# Patient Record
Sex: Female | Born: 1985 | Race: White | Hispanic: No | Marital: Married | Smoking: Never smoker
Health system: Southern US, Community
[De-identification: ages and names within clinical notes are randomized; demographics above are authoritative.]

## PROBLEM LIST (undated history)

## (undated) DIAGNOSIS — Z789 Other specified health status: Secondary | ICD-10-CM

## (undated) HISTORY — PX: APPENDECTOMY: SHX54

---

## 2014-08-01 ENCOUNTER — Ambulatory Visit (INDEPENDENT_AMBULATORY_CARE_PROVIDER_SITE_OTHER): Payer: BC Managed Care – PPO | Admitting: Internal Medicine

## 2014-08-01 VITALS — BP 108/70 | HR 61 | Temp 98.1°F | Resp 16 | Ht 66.5 in | Wt 150.8 lb

## 2014-08-01 DIAGNOSIS — R300 Dysuria: Secondary | ICD-10-CM

## 2014-08-01 DIAGNOSIS — N3000 Acute cystitis without hematuria: Secondary | ICD-10-CM

## 2014-08-01 LAB — POCT URINALYSIS DIPSTICK
BILIRUBIN UA: NEGATIVE
Glucose, UA: NEGATIVE
Ketones, UA: NEGATIVE
Nitrite, UA: POSITIVE
Protein, UA: NEGATIVE
Urobilinogen, UA: 1
pH, UA: 6

## 2014-08-01 LAB — POCT UA - MICROSCOPIC ONLY
Casts, Ur, LPF, POC: NEGATIVE
Crystals, Ur, HPF, POC: NEGATIVE
Mucus, UA: NEGATIVE
YEAST UA: NEGATIVE

## 2014-08-01 MED ORDER — CIPROFLOXACIN HCL 500 MG PO TABS: 500.0000 mg | ORAL_TABLET | Freq: Two times a day (BID) | ORAL | Status: DC

## 2014-08-01 NOTE — Patient Instructions (Signed)
Take the antibiotic as directed. Increase fluids. Return to have re eval if no improvement in symptoms or if worsening of symptoms.

## 2014-08-01 NOTE — Progress Notes (Signed)
   Subjective:    Patient ID: Melanie Munoz, female    DOB: 20-Dec-1985, 29 y.o.   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  Negative for fever, chills, diaphoresis and fatigue.  Genitourinary: Positive for dysuria, urgency and frequency. Negative for flank pain.  All other systems reviewed and are negative.      Objective:   Physical Exam  Constitutional: She is oriented to person, place, and time. She appears well-developed and well-nourished.  HENT:  Head: Normocephalic and atraumatic.  Mouth/Throat: Oropharynx is clear and moist.  Eyes: Conjunctivae and EOM are normal. Pupils are equal, round, and reactive to light.  Neck: Normal range of motion. Neck supple.  Cardiovascular: Normal rate, regular rhythm, normal heart sounds and intact distal pulses.   Pulmonary/Chest: Effort normal and breath sounds normal.  Abdominal: Soft. Bowel sounds are normal. She exhibits no distension. There is no tenderness. There is no rebound and no guarding.  Musculoskeletal: Normal range of motion.  Neurological: She is alert and oriented to person, place, and time.  Skin: Skin is warm and dry.  Psychiatric: She has a normal mood and affect. Her behavior is normal. Judgment and thought content normal.  Nursing note and vitals reviewed.   No results found for this or any previous visit. No results found for this or any previous visit.     Results for orders placed or performed in visit on 08/01/14  POCT UA - Microscopic Only  Result Value Ref Range   WBC, Ur, HPF, POC 5-10    RBC, urine, microscopic  2-4    Bacteria, U Microscopic small    Mucus, UA neg    Epithelial cells, urine per micros 1-3    Crystals, Ur, HPF, POC neg    Casts, Ur, LPF, POC neg    Yeast, UA neg   POCT urinalysis dipstick  Result Value Ref Range   Color, UA orange    Clarity, UA hazy    Glucose, UA neg    Bilirubin, UA neg    Ketones, UA neg    Spec Grav, UA <=1.005    Blood, UA moderate    pH, UA 6.0    Protein, UA neg    Urobilinogen, UA 1.0    Nitrite, UA pos    Leukocytes, UA small (1+)   dx uti Assessment & Plan:  29 year old female with symptoms of uti will eval ua and rx accordingly with antibiotics and increased po fluids.

## 2016-04-01 ENCOUNTER — Other Ambulatory Visit (HOSPITAL_COMMUNITY): Payer: Self-pay | Admitting: Obstetrics & Gynecology

## 2016-04-01 DIAGNOSIS — N979 Female infertility, unspecified: Secondary | ICD-10-CM

## 2016-04-05 ENCOUNTER — Ambulatory Visit (HOSPITAL_COMMUNITY): Payer: Self-pay

## 2016-08-01 ENCOUNTER — Other Ambulatory Visit: Payer: Self-pay | Admitting: Obstetrics & Gynecology

## 2016-08-01 ENCOUNTER — Ambulatory Visit (HOSPITAL_COMMUNITY)
Admission: RE | Admit: 2016-08-01 | Discharge: 2016-08-01 | Disposition: A | Discharge status: Home / Self Care | Payer: BC Managed Care – PPO | Source: Ambulatory Visit | Attending: Obstetrics & Gynecology | Admitting: Obstetrics & Gynecology

## 2016-08-01 DIAGNOSIS — R52 Pain, unspecified: Secondary | ICD-10-CM | POA: Diagnosis not present

## 2016-08-01 DIAGNOSIS — M7989 Other specified soft tissue disorders: Secondary | ICD-10-CM | POA: Insufficient documentation

## 2016-08-01 NOTE — Progress Notes (Signed)
*  PRELIMINARY RESULTS* Vascular Ultrasound Lower extremity venous duplex has been completed.  Preliminary findings: No evidence of DVT or baker's cyst.   Called results to Dr. Juliene PinaMody.    Farrel DemarkJill Eunice, RDMS, RVT  08/01/2016, 5:03 PM

## 2016-11-09 ENCOUNTER — Inpatient Hospital Stay (HOSPITAL_COMMUNITY): Payer: BC Managed Care – PPO

## 2016-11-09 ENCOUNTER — Encounter (HOSPITAL_COMMUNITY): Payer: Self-pay | Admitting: *Deleted

## 2016-11-09 ENCOUNTER — Inpatient Hospital Stay (HOSPITAL_COMMUNITY)
Admission: AD | Admit: 2016-11-09 | Discharge: 2016-11-12 | DRG: 782 | Disposition: A | Discharge status: Home / Self Care | Payer: BC Managed Care – PPO | Source: Ambulatory Visit | Attending: Obstetrics and Gynecology | Admitting: Obstetrics and Gynecology

## 2016-11-09 DIAGNOSIS — Z3A31 31 weeks gestation of pregnancy: Secondary | ICD-10-CM

## 2016-11-09 DIAGNOSIS — O4693 Antepartum hemorrhage, unspecified, third trimester: Secondary | ICD-10-CM

## 2016-11-09 DIAGNOSIS — O4413 Placenta previa with hemorrhage, third trimester: Secondary | ICD-10-CM | POA: Diagnosis present

## 2016-11-09 DIAGNOSIS — O4403 Placenta previa specified as without hemorrhage, third trimester: Secondary | ICD-10-CM | POA: Diagnosis present

## 2016-11-09 LAB — CBC
HCT: 32 % — ABNORMAL LOW (ref 36.0–46.0)
Hemoglobin: 11.2 g/dL — ABNORMAL LOW (ref 12.0–15.0)
MCH: 33.2 pg (ref 26.0–34.0)
MCHC: 35 g/dL (ref 30.0–36.0)
MCV: 95 fL (ref 78.0–100.0)
Platelets: 196 10*3/uL (ref 150–400)
RBC: 3.37 MIL/uL — AB (ref 3.87–5.11)
RDW: 12 % (ref 11.5–15.5)
WBC: 8.1 10*3/uL (ref 4.0–10.5)

## 2016-11-09 LAB — TYPE AND SCREEN
ABO/RH(D): A POS
Antibody Screen: NEGATIVE

## 2016-11-09 MED ORDER — SODIUM CHLORIDE 0.9% FLUSH: 3.0000 mL | INTRAVENOUS | Status: DC | PRN

## 2016-11-09 MED ORDER — SODIUM CHLORIDE 0.9% FLUSH
3.0000 mL | Freq: Two times a day (BID) | INTRAVENOUS | Status: DC
  Administered 2016-11-10 – 2016-11-12 (×4): 3 mL via INTRAVENOUS

## 2016-11-09 MED ORDER — PRENATAL MULTIVITAMIN CH
1.0000 | ORAL_TABLET | Freq: Every day | ORAL | Status: DC
  Administered 2016-11-10: 1 via ORAL
  Filled 2016-11-09 (×2): qty 1

## 2016-11-09 MED ORDER — BETAMETHASONE SOD PHOS & ACET 6 (3-3) MG/ML IJ SUSP
12.0000 mg | INTRAMUSCULAR | Status: AC
  Administered 2016-11-09 – 2016-11-10 (×2): 12 mg via INTRAMUSCULAR
  Filled 2016-11-09 (×2): qty 2

## 2016-11-09 MED ORDER — CALCIUM CARBONATE ANTACID 500 MG PO CHEW: 2.0000 | CHEWABLE_TABLET | ORAL | Status: DC | PRN

## 2016-11-09 MED ORDER — DOCUSATE SODIUM 100 MG PO CAPS
100.0000 mg | ORAL_CAPSULE | Freq: Every day | ORAL | Status: DC
  Administered 2016-11-10 – 2016-11-11 (×2): 100 mg via ORAL
  Filled 2016-11-09 (×5): qty 1

## 2016-11-09 MED ORDER — ACETAMINOPHEN 325 MG PO TABS: 650.0000 mg | ORAL_TABLET | ORAL | Status: DC | PRN

## 2016-11-09 MED ORDER — ZOLPIDEM TARTRATE 5 MG PO TABS: 5.0000 mg | ORAL_TABLET | Freq: Every evening | ORAL | Status: DC | PRN

## 2016-11-09 MED ORDER — SODIUM CHLORIDE 0.9 % IV SOLN: 250.0000 mL | INTRAVENOUS | Status: DC | PRN

## 2016-11-09 NOTE — MAU Provider Note (Signed)
  History     CSN: [id number]  Arrival date and time: 11/09/16 2055   First Provider Initiated Contact with Patient 11/09/16 2140      Chief Complaint  Patient presents with  . Vaginal Bleeding   HPI [name] is a 31 y.o. G1P0 at 3541w5d who presents to MAU today with complaint of vaginal bleeding. The patient had US in the office on 10/28/16 that showed a complete placenta previa. She states spotting a few weeks ago and then no bleeding until tonight. She states that she bled through her underwear and pants. She put on a pad to come in and had a small amount on the pad. She states some cramping yesterday and intermittent sharp pains tonight. She denies LOF or other complications with the pregnancy. She reports good fetal movement.    OB History    Gravida Para Term Preterm AB Living   1             SAB TAB Ectopic Multiple Live Births                  No past medical history on file.  Past Surgical History:  Procedure Laterality Date  . APPENDECTOMY      Family History  Problem Relation Age of Onset  . Diabetes Mother   . Cancer Father   . Cancer Maternal Grandmother   . Diabetes Maternal Grandfather     Social History  Substance Use Topics  . Smoking status: Never Smoker  . Smokeless tobacco: Not on file  . Alcohol use No    Allergies: No Known Allergies  Prescriptions Prior to Admission  Medication Sig Dispense Refill Last Dose  . Prenatal Vit-Fe Fumarate-FA (PRENATAL MULTIVITAMIN) TABS tablet Take 1 tablet by mouth daily at 12 noon.     . ciprofloxacin (CIPRO) 500 MG tablet Take 1 tablet (500 mg total) by mouth 2 (two) times daily. 10 tablet 0   . phenazopyridine (PYRIDIUM) 95 MG tablet Take 95 mg by mouth 3 (three) times daily as needed for pain.       Review of Systems  Constitutional: Negative for fever.  Gastrointestinal: Positive for abdominal pain and nausea. Negative for constipation, diarrhea and vomiting.  Genitourinary: Positive for vaginal  bleeding. Negative for vaginal discharge.   Physical Exam   Blood pressure 112/75, pulse 89, temperature 98.2 F (36.8 C), temperature source Oral, resp. rate 17, height 5' 6.25" (1.683 m), weight 181 lb (82.1 kg), SpO2 98 %.  Physical Exam  Nursing note and vitals reviewed. Constitutional: She is oriented to person, place, and time. She appears well-developed and well-nourished. No distress.  HENT:  Head: Normocephalic and atraumatic.  Cardiovascular: Normal rate.   Respiratory: Effort normal.  GI: Soft. She exhibits no distension and no mass. There is no tenderness. There is no rebound and no guarding.  Neurological: She is alert and oriented to person, place, and time.  Skin: Skin is warm and dry. No erythema.  Psychiatric: She has a normal mood and affect.    Fetal Monitoring: Baseline: 120 bpm Variability: moderate Accelerations: 15 x 15 Decelerations: none Contractions: none  MAU Course  Procedures None  MDM Discussed patient with Dr. Billy Coastaavon. Admit for BMZ and continuous monitoring.   Assessment and Plan  A: SIUP at 3841w5d Placenta previa, complete Vaginal bleeding in pregnancy, third trimester   P: Admit to [facility] for monitoring and BMZ  Melanie NippleJulie Endre Coutts, PA-C 11/09/2016, 10:05 PM

## 2016-11-09 NOTE — MAU Note (Signed)
Pt presents to MAU c/o bleeding pt states she was bleeding thru her underwear and pants and still is bleeding (bright red like a period)when she went to the bathroom. Pt states cramping yesterday but today she is having pain in the "middle of her stomach today" rating it at a 7 out of 10. Pt states pain under her ribs which has been constant in this pregnancy.

## 2016-11-09 NOTE — MAU Provider Note (Addendum)
History   HISTORY AND PHYSICAL  CSN: [id number]  Arrival date and time: 11/09/16 2055   First Provider Initiated Contact with Patient 11/09/16 2140      Chief Complaint  Patient presents with  . Vaginal Bleeding   Vaginal Bleeding  The patient's pertinent negatives include no vaginal discharge. Associated symptoms include abdominal pain and nausea. Pertinent negatives include no constipation, diarrhea, fever or vomiting.   Melanie Munoz is a 31 y.o. G1P0 at [redacted]w[redacted]d who presents to MAU today with complaint of vaginal bleeding. The patient had [place] in the office on 10/28/16 that showed a complete placenta previa. She states spotting a few weeks ago and then no bleeding until tonight. She states that she bled through her underwear and pants. She put on a pad to come in and had a small amount on the pad. She states some cramping yesterday and intermittent sharp pains tonight. She denies LOF or other complications with the pregnancy. She reports good fetal movement.    OB History    Gravida Para Term Preterm AB Living   1             SAB TAB Ectopic Multiple Live Births                  No past medical history on file.  Past Surgical History:  Procedure Laterality Date  . APPENDECTOMY      Family History  Problem Relation Age of Onset  . Diabetes Mother   . Cancer Father   . Cancer Maternal Grandmother   . Diabetes Maternal Grandfather     Social History  Substance Use Topics  . Smoking status: Never Smoker  . Smokeless tobacco: Not on file  . Alcohol use No    Allergies: No Known Allergies  Prescriptions Prior to Admission  Medication Sig Dispense Refill Last Dose  . Prenatal Vit-Fe Fumarate-FA (PRENATAL MULTIVITAMIN) TABS tablet Take 1 tablet by mouth daily at 12 noon.     . ciprofloxacin (CIPRO) 500 MG tablet Take 1 tablet (500 mg total) by mouth 2 (two) times daily. 10 tablet 0   . phenazopyridine (PYRIDIUM) 95 MG tablet Take 95 mg by mouth 3 (three) times daily as  needed for pain.       Review of Systems  Constitutional: Negative for fever.  Gastrointestinal: Positive for abdominal pain and nausea. Negative for constipation, diarrhea and vomiting.  Genitourinary: Positive for vaginal bleeding. Negative for vaginal discharge.   Physical Exam   Blood pressure 112/75, pulse 89, temperature 98.2 F (36.8 C), temperature source Oral, resp. rate 17, height 5' 6.25" (1.683 m), weight 82.1 kg (181 lb), SpO2 98 %.  Physical Exam  Nursing note and vitals reviewed. Constitutional: She is oriented to person, place, and time. She appears well-developed and well-nourished. No distress.  HENT:  Head: Normocephalic and atraumatic.  Cardiovascular: Normal rate.   Respiratory: Effort normal.  GI: Soft. She exhibits no distension and no mass. There is no tenderness. There is no rebound and no guarding.  Neurological: She is alert and oriented to person, place, and time.  Skin: Skin is warm and dry. No erythema.  Psychiatric: She has a normal mood and affect.  Pelvic: minimal blood on pad. VE deferred  Fetal Monitoring: Baseline: 120 bpm Variability: moderate Accelerations: 15 x 15 Decelerations: none Contractions: none Category 1 tracing  MAU Course  Procedures None  MDM Sono 5/7 - complete posterior previa, nl AFI, AGA Hgb 11.3 Otherwise uncomplicated prenatal care  Assessment and Plan  A: 1120w5d IUP Placenta previa, complete, posterior Vaginal bleeding in pregnancy, third trimester - second bleed, stable No evidence PTL  P: Admit BMZ series Continuous EFM and Toco tonight Consider MgSO4 for neuroprotection pending course. CBC and TS BR and BRP Saline lock NICU consult in am Sono to ck AFI, placental location  Karie Skowron J, PA-C 11/09/2016, 10:55 PM

## 2016-11-10 ENCOUNTER — Encounter (HOSPITAL_COMMUNITY): Payer: Self-pay | Admitting: *Deleted

## 2016-11-10 LAB — ABO/RH: ABO/RH(D): A POS

## 2016-11-10 MED ORDER — LACTATED RINGERS IV BOLUS (SEPSIS)
1000.0000 mL | Freq: Once | INTRAVENOUS | Status: DC
  Administered 2016-11-10: 1000 mL via INTRAVENOUS

## 2016-11-10 MED ORDER — PRENATAL MULTIVITAMIN CH
1.0000 | ORAL_TABLET | Freq: Every day | ORAL | Status: DC
  Administered 2016-11-11: 1 via ORAL
  Filled 2016-11-10: qty 1

## 2016-11-10 MED ORDER — LACTATED RINGERS IV SOLN: Freq: Once | INTRAVENOUS | Status: DC

## 2016-11-10 NOTE — Consult Note (Signed)
Neonatology Consult:  Asked by Dr Billy Coastaavon to consult on Melanie Munoz for placenta previa at 5531 6/7 weeks. Chart reviewed. She was admitted yesterday for vaginal bleeding which appears to be decreasing now. She started receiving betamethasone. Complete previa on US, AGA.  I spoke to Melanie Munoz in her room. I discussed possible scenarios if the baby had to be delivered in the next few days vs later at 35 wks or older. I talked about Neonatology role at delivery. I also discussed neonatal complications at earlier gestation and talked about RDS, various  resp support depending on severity of lung disease. We also talked about breast milk, gavage feeding, IV fluids, and temp support.  I answered her questions fully.  Thank you for inviting us to participate in Melanie Minjares's care.  Lucillie Garfinkelita Q Gurnoor Ursua MD Neonatologist

## 2016-11-10 NOTE — Progress Notes (Signed)
Pt has saline lock in her right hand. CDI dressing. Site WNL. Flushed with 3cc saline flush.

## 2016-11-10 NOTE — Progress Notes (Signed)
Dr. Katrinka BlazingSmith in NICU notified of need for consult.

## 2016-11-10 NOTE — Progress Notes (Addendum)
Patient ID: Melanie BeckAmber Denno, female   DOB: October 04, 1985, 31 y.o.   MRN: [id number] HD #1 31w 6d Complete Previa  S: Good FM. Still with spotting. History of increased bleeding yesterday which has decreased since admission. Denies SROM. No contractions. No SOB. No CP . No pain noted. Felt crampy on Friday. History of one episode of spotting a few weeks ago. No intervention at that time.  O: BP 116/66   Pulse (!) 101   Temp 98.1 F (36.7 C) (Oral)   Resp 16   Ht 5' 6.25" (1.683 m)   Wt 82.1 kg (181 lb)   SpO2 98%   BMI 28.99 kg/m   WDWN WF NAD NCAT Neck: supple with FROM Lgs: CTA CV: RRR Abd: gravid and NT Neg CVAT VE: minimal brownish spotting on pad EXT: SCDs intact Neuro: nonfocal  Skin: intact  Category 1 tracing FHR 120-130, BTBV > 5, 15x15 accels, no decels, no contractions  Sono confirms left lateral post complete previa with nl AFI CBC    Component Value Date/Time   WBC 8.1 11/09/2016 2215   RBC 3.37 (L) 11/09/2016 2215   HGB 11.2 (L) 11/09/2016 2215   HCT 32.0 (L) 11/09/2016 2215   PLT 196 11/09/2016 2215   MCV 95.0 11/09/2016 2215   MCH 33.2 11/09/2016 2215   MCHC 35.0 11/09/2016 2215   RDW 12.0 11/09/2016 2215    A: 31w 6d IUP Complete Previa- Bleeding episode #2. BMZ #1 given. Minimal bleeding at this time AGA by sono 5/7- reassuring FHR status  P: NST q shift Saline lock Current T&S BMZ #2 today Discussed timing of delivery Discussed outpt management if no bleeding x 24 hrs. No evidence of imminent delivery so no Mg neuroprotection at this time

## 2016-11-11 NOTE — Progress Notes (Signed)
Patient ID: Emilia BeckAmber Hemme, female   DOB: 06-16-1986, 31 y.o.   MRN: [id number] HD #2 32w 0d Complete Previa  S: Good FM. No LOF. Pt notes spotting yesterday but none today. She does have brown d/c with wiping. No cramping. Pt notes overdoing it at work and didn't realize how uncomfortable she's been until she has rested these past few days in the hospital.   No tocolysis, no Mag, 2nd BMZ last night.   O: BP 116/70 (BP Location: Right Arm)   Pulse (!) 103   Temp 97.7 F (36.5 C) (Oral)   Resp 18   Ht 5' 6.25" (1.683 m)   Wt 181 lb (82.1 kg)   SpO2 99%   BMI 28.99 kg/m    Gen: well appearing, no distress Lgs: CTA CV: RRR Abd: gravid and NT Neg CVAT VE: no staining on pad EXT: SCDs intact Neuro: nonfocal  Skin: intact  Category 1 tracing FHR 120-130, BTBV > 5, 15x15 accels, no decels, no contractions  Sono confirms left lateral post complete previa with nl AFI  CBC    Component Value Date/Time   WBC 8.1 11/09/2016 2215   RBC 3.37 (L) 11/09/2016 2215   HGB 11.2 (L) 11/09/2016 2215   HCT 32.0 (L) 11/09/2016 2215   PLT 196 11/09/2016 2215   MCV 95.0 11/09/2016 2215   MCH 33.2 11/09/2016 2215   MCHC 35.0 11/09/2016 2215   RDW 12.0 11/09/2016 2215    A: G1 at 3239w0d Complete Previa- Bleeding episode #2 (1st bleed since viability). BMZ complete tonight. No  bleeding at this time AGA by sono 5/7- reassuring FHR status  P: NST q shift Saline lock Current T&S OK for shower, and bathroom privileges. Continue pelvic rest. Will plan modified bedrest x 1 wk upon d/c.  Discussed timing of delivery, will consider moving c/s to 37 wks.  If no further bleeding by tomorrow am will d/c home.  Kienan Doublin A. 11/11/2016 12:58 PM

## 2016-11-12 NOTE — Progress Notes (Signed)
HD#3 32 1/7 wk Previa  O: BP (!) 105/57 (BP Location: Left Arm)   Pulse 94   Temp 97.4 F (36.3 C) (Oral)   Resp 16   Ht 5' 6.25" (1.683 m)   Wt 82.1 kg (181 lb)   SpO2 98%   BMI 28.99 kg/m  Lungs clear to A Cor RRR Abd gravid nontender Pelvic deferred  pad scant brown d/c Extr: no edema  Tracing: baseline 140 (+) accel to 160 No ctx  IMP: Previa s/p 2nd bleed here now. BMZ complete IUP @ 32 1/7 weeks P) cont inpt. Pt notified will have Dr Ernestina PennaFogleman see her regarding d/c

## 2016-11-12 NOTE — Progress Notes (Signed)
Patient expressing concern over plan of care. Dr. Ernestina PennaFogleman arrived at bedside to talk to patient.

## 2016-11-12 NOTE — Discharge Summary (Signed)
Patient ID: Melanie Munoz MRN: [id number] DOB/AGE: 1985-08-16 31 y.o.  Admit date: 11/09/2016 Discharge date: 11/12/16  Admission Diagnoses: 32 WKS, PLACENTA PREVIA, BLEEDING  Discharge Diagnoses: 32 WKS, PLACENTA PREVIA, BLEEDING         Discharged Condition: good  Hospital Course: Pt admitted with vaginal bleeding. Pt received BMX x 2. She did NOT have tocolysis or magnesium. She bled minimally Saturday night but bleeding stopped Sun. No further bleeding, never with significant contractions. Reactive fetal testing.   Consults: None and MFM  Treatments: IV hydration, steroids: BMZ and bedrest  Disposition: home   Allergies as of 11/12/2016   No Known Allergies     Medication List    TAKE these medications   prenatal multivitamin Tabs tablet Take 2 tablets by mouth at bedtime.        Signed: Lendon ColonelFOGLEMAN,Jenny Lai A., MD MD 11/12/2016, 9:22 PM

## 2016-11-18 ENCOUNTER — Inpatient Hospital Stay (HOSPITAL_COMMUNITY)
Admission: AD | Admit: 2016-11-18 | Discharge: 2016-12-06 | DRG: 765 | Disposition: A | Discharge status: Home / Self Care | Payer: BC Managed Care – PPO | Source: Ambulatory Visit | Attending: Obstetrics | Admitting: Obstetrics

## 2016-11-18 ENCOUNTER — Encounter (HOSPITAL_COMMUNITY): Payer: Self-pay | Admitting: *Deleted

## 2016-11-18 DIAGNOSIS — O4413 Placenta previa with hemorrhage, third trimester: Principal | ICD-10-CM | POA: Diagnosis present

## 2016-11-18 DIAGNOSIS — D62 Acute posthemorrhagic anemia: Secondary | ICD-10-CM | POA: Diagnosis not present

## 2016-11-18 DIAGNOSIS — Z3A33 33 weeks gestation of pregnancy: Secondary | ICD-10-CM

## 2016-11-18 DIAGNOSIS — Z98891 History of uterine scar from previous surgery: Secondary | ICD-10-CM

## 2016-11-18 DIAGNOSIS — O9081 Anemia of the puerperium: Secondary | ICD-10-CM | POA: Diagnosis not present

## 2016-11-18 DIAGNOSIS — O4400 Placenta previa specified as without hemorrhage, unspecified trimester: Secondary | ICD-10-CM

## 2016-11-18 DIAGNOSIS — D509 Iron deficiency anemia, unspecified: Secondary | ICD-10-CM | POA: Diagnosis present

## 2016-11-18 DIAGNOSIS — O4403 Placenta previa specified as without hemorrhage, third trimester: Secondary | ICD-10-CM | POA: Diagnosis present

## 2016-11-18 LAB — TYPE AND SCREEN
ABO/RH(D): A POS
ANTIBODY SCREEN: NEGATIVE

## 2016-11-18 LAB — CBC
HEMATOCRIT: 33.5 % — AB (ref 36.0–46.0)
HEMOGLOBIN: 11.5 g/dL — AB (ref 12.0–15.0)
MCH: 32.4 pg (ref 26.0–34.0)
MCHC: 34.3 g/dL (ref 30.0–36.0)
MCV: 94.4 fL (ref 78.0–100.0)
Platelets: 215 10*3/uL (ref 150–400)
RBC: 3.55 MIL/uL — ABNORMAL LOW (ref 3.87–5.11)
RDW: 11.9 % (ref 11.5–15.5)
WBC: 8.2 10*3/uL (ref 4.0–10.5)

## 2016-11-18 MED ORDER — SODIUM CHLORIDE 0.9 % IV SOLN
8.0000 mg | Freq: Three times a day (TID) | INTRAVENOUS | Status: DC | PRN
  Administered 2016-11-18: 8 mg via INTRAVENOUS
  Filled 2016-11-18: qty 4

## 2016-11-18 MED ORDER — ACETAMINOPHEN 325 MG PO TABS: 650.0000 mg | ORAL_TABLET | ORAL | Status: DC | PRN

## 2016-11-18 MED ORDER — PRENATAL MULTIVITAMIN CH
1.0000 | ORAL_TABLET | Freq: Every day | ORAL | Status: DC
  Administered 2016-11-18 – 2016-12-02 (×15): 1 via ORAL
  Filled 2016-11-18 (×17): qty 1

## 2016-11-18 MED ORDER — ZOLPIDEM TARTRATE 5 MG PO TABS: 5.0000 mg | ORAL_TABLET | Freq: Every evening | ORAL | Status: DC | PRN

## 2016-11-18 MED ORDER — PANTOPRAZOLE SODIUM 40 MG PO TBEC
40.0000 mg | DELAYED_RELEASE_TABLET | Freq: Every day | ORAL | Status: DC
  Administered 2016-11-18 – 2016-12-03 (×16): 40 mg via ORAL
  Filled 2016-11-18 (×19): qty 1

## 2016-11-18 MED ORDER — SODIUM CHLORIDE 0.9 % IV SOLN
INTRAVENOUS | Status: DC
  Administered 2016-11-18 (×2): via INTRAVENOUS

## 2016-11-18 MED ORDER — CALCIUM CARBONATE ANTACID 500 MG PO CHEW: 2.0000 | CHEWABLE_TABLET | ORAL | Status: DC | PRN

## 2016-11-18 MED ORDER — LACTATED RINGERS IV BOLUS (SEPSIS)
1000.0000 mL | Freq: Once | INTRAVENOUS | Status: AC
  Administered 2016-11-18: 1000 mL via INTRAVENOUS

## 2016-11-18 MED ORDER — DOCUSATE SODIUM 100 MG PO CAPS
100.0000 mg | ORAL_CAPSULE | Freq: Every day | ORAL | Status: DC
  Administered 2016-11-18 – 2016-11-29 (×12): 100 mg via ORAL
  Filled 2016-11-18 (×13): qty 1

## 2016-11-18 MED ORDER — NIFEDIPINE 10 MG PO CAPS
20.0000 mg | ORAL_CAPSULE | Freq: Once | ORAL | Status: AC
  Administered 2016-11-18: 20 mg via ORAL
  Filled 2016-11-18: qty 2

## 2016-11-18 MED ORDER — PROMETHAZINE HCL 25 MG/ML IJ SOLN
12.5000 mg | Freq: Four times a day (QID) | INTRAMUSCULAR | Status: DC | PRN
  Administered 2016-11-18 – 2016-11-23 (×2): 12.5 mg via INTRAVENOUS
  Filled 2016-11-18 (×2): qty 1

## 2016-11-18 NOTE — MAU Provider Note (Signed)
  History     CSN: [id number]  Arrival date and time: 11/18/16 0340   First Provider Initiated Contact with Patient 11/18/16 0359      Chief Complaint  Patient presents with  . Vaginal Bleeding   Vaginal Bleeding  The patient's primary symptoms include vaginal bleeding. The patient's pertinent negatives include no pelvic pain. This is a new problem. The current episode started today (around 0330. ). The problem has been unchanged. The patient is experiencing no pain (some occasional pain when the baby moves, but not feeling any contractions at this time). She is pregnant. Associated symptoms include vomiting (x1 around 2100 yesterday). Pertinent negatives include no chills, dysuria, fever, frequency, nausea or urgency. The vaginal discharge was bloody. The vaginal bleeding is heavier than menses. She has been passing clots (about the size of an apple ). She has not been passing tissue. Nothing (patient has been on modified bed rest at home. ) aggravates the symptoms. She has tried nothing for the symptoms. She is not sexually active.     History reviewed. No pertinent past medical history.  Past Surgical History:  Procedure Laterality Date  . APPENDECTOMY      Family History  Problem Relation Age of Onset  . Diabetes Mother   . Cancer Father   . Cancer Maternal Grandmother   . Diabetes Maternal Grandfather     Social History  Substance Use Topics  . Smoking status: Never Smoker  . Smokeless tobacco: Never Used  . Alcohol use No    Allergies: No Known Allergies  Prescriptions Prior to Admission  Medication Sig Dispense Refill Last Dose  . Prenatal Vit-Fe Fumarate-FA (PRENATAL MULTIVITAMIN) TABS tablet Take 2 tablets by mouth at bedtime.    11/17/2016 at Unknown time    Review of Systems  Constitutional: Negative for chills and fever.  Gastrointestinal: Positive for vomiting (x1 around 2100 yesterday). Negative for nausea.  Genitourinary: Positive for vaginal bleeding.  Negative for dysuria, frequency, pelvic pain and urgency.   Physical Exam   Blood pressure 117/81, pulse 94, temperature 97.7 F (36.5 C), temperature source Oral, resp. rate 20.  Physical Exam  Nursing note and vitals reviewed. Constitutional: She is oriented to person, place, and time. She appears well-developed and well-nourished. No distress.  HENT:  Head: Normocephalic.  Cardiovascular: Normal rate.   Respiratory: Effort normal.  GI: Soft. There is no tenderness. There is no rebound.  Genitourinary:  Genitourinary Comments: One pad about halfway saturated.  Current pad with small amount of blood on the pad  Neurological: She is alert and oriented to person, place, and time.  Skin: Skin is warm and dry.  Psychiatric: She has a normal mood and affect.   FHT: 135, moderate with 15x15 accels, no decels Toco: occasional UC  MAU Course  Procedures  MDM 0407: Dr. Ernestina PennaFogleman called. She is in the hospital. She will come see the patient.  [id number]: Dr. Ernestina PennaFogleman on the unit. Assumes care of the patient.  Assessment and Plan  Placenta previa 33.0 weeks Patient has had BMZ on 5/19 and 5/20  Ernestina PennaFogleman assumes care of the patient.   Thressa ShellerHeather Hogan 11/18/2016, 4:00 AM

## 2016-11-18 NOTE — H&P (Signed)
Melanie Munoz is a 31 y.o. G1P0 at 7258w0d presenting for bleeding in the setting of placenta previa. Pt notes rare contractions- notes cyclic cramping- was noting it as "pain when baby moves".  Good fetal movement,  vaginal bleeding started at 3:30- notes waking up and 'blood was everywhere." Pt presented to MAU with pad "half full", passed 2 dimed sized clots while in MAU and currently has small staining on large pad. Not leaking fluid. Pt had been discharged from hospital about 1 wk ago after initial bleed episode.   [facility] 16 w[facility] - Dated by 6 wks u/s - Bleeding- early bleeding in 1st trimester [facility], bled at 16 wks, diagnosed with posterior previa; single episode spotting with wiping at 29 wks- presented later that day and no bleeding on exam, no treatment given. 1st bleed episode at 31'5, given BMZ and admitted x 3 days. Now with 2nd significant bleed. - Declined genetic screening - GBS unknown - R-NI  Prenatal Transfer Tool  Maternal Diabetes: No Genetic Screening: Declined Maternal Ultrasounds/Referrals: Abnormal:  Findings:   Other: placenta previa Fetal Ultrasounds or other Referrals:  None Maternal Substance Abuse:  No Significant Maternal Medications:  None Significant Maternal Lab Results: None     OB History    Gravida Para Term Preterm AB Living   1             SAB TAB Ectopic Multiple Live Births                 History reviewed. No pertinent past medical history. Past Surgical History:  Procedure Laterality Date  . APPENDECTOMY     Family History: family history includes Cancer in her father and maternal grandmother; Diabetes in her maternal grandfather and mother. Social History:  reports that she has never smoked. She has never used smokeless tobacco. She reports that she does not drink alcohol or use drugs.  Review of Systems - Negative except bleeding, occasional contracton     Blood pressure 105/68, pulse 91, temperature 97.7 F  (36.5 C), temperature source Oral, resp. rate 20.  Physical Exam:  Gen: well appearing, no distress Back: no CVAT Abd: gravid, NT, no RUQ pain, mild fundal tenderness LE: no edema, equal bilaterally, non-tender Toco: irregular contraction q 10-15 mn FH: baseline 140s, accelerations present, no deceleratons, 10 beat variability  Prenatal labs: ABO, Rh: --/--/A POS, A POS (05/19 2215) Antibody: NEG (05/19 2215) Rubella: !Error! NON-IMMUNE RPR:   NR HBsAg:   neg HIV:   neg GBS:   not done 1 hr Glucola <100  Genetic screening declined Anatomy US normal  CBC Latest Ref Rng & Units 11/18/2016 11/09/2016  WBC 4.0 - 10.5 K/uL 8.2 8.1  Hemoglobin 12.0 - 15.0 g/dL 11.5(L) 11.2(L)  Hematocrit 36.0 - 46.0 % 33.5(L) 32.0(L)  Platelets 150 - 400 K/uL 215 196     Assessment/Plan: 31 y.o. G1P0 at 7358w0d Placenta previa. Small bleeding at this point. Will manage expectantly. S/p BMZ at last visit, no indication to repeat. Pt is now >32 wks and will not plan Magnesium. Given pt with occasional contraction, will give 1 time Procardia dose to minimize uterine irritibility. Bed rest, continuous fetal monitoring. Will keep pt on labor floor at this time and re-eval mid morning. NPO. Should maternal or fetal status change will proceed with PCS, pt aware of plan.  - reactive fetal testing, s/p NICU consult  Finas Delone A. 11/18/2016, 4:47 AM

## 2016-11-18 NOTE — MAU Note (Signed)
Pt presents to MAU bleeding (pt has placenta previa) pt states having saturated one pad at home. Pts current pad is also saturated with blood. Pt states she passed 2 clots at home bigger than a size of a quarter. Pt denies pain at this time. Pt denies LOF. Pt had stated decreased fetal movement but states he is moving a lot more now after applying monitors.

## 2016-11-19 MED ORDER — POLYSACCHARIDE IRON COMPLEX 150 MG PO CAPS
150.0000 mg | ORAL_CAPSULE | Freq: Every day | ORAL | Status: DC
  Administered 2016-11-19 – 2016-12-03 (×15): 150 mg via ORAL
  Filled 2016-11-19 (×16): qty 1

## 2016-11-19 NOTE — Progress Notes (Signed)
HD # 2- placenta previa, 2nd bleed  S: Pt notes still pink spotting with wiping, no active bleeding. No further abdominal pain. No ctx, no LOF, good FM. No CP or SOB.  PE:  Vitals:   11/19/16 0016 11/19/16 0410 11/19/16 0709 11/19/16 0811  BP: (!) 100/57 (!) 95/54  104/65  Pulse: 87 80  81  Resp: 18 18  18   Temp: 98.3 F (36.8 C) 98.2 F (36.8 C)  98.4 F (36.9 C)  TempSrc: Oral Oral  Oral  SpO2: 98% 97%  98%  Weight:   181 lb 4 oz (82.2 kg)   Height:       Gen: well appearing, no distress CV: RRR Pulm: CTAB Abd: soft, gravid, NT, no fundal tenderness LE: NT, no edema  CBC    Component Value Date/Time   WBC 8.2 11/18/2016 0412   RBC 3.55 (L) 11/18/2016 0412   HGB 11.5 (L) 11/18/2016 0412   HCT 33.5 (L) 11/18/2016 0412   PLT 215 11/18/2016 0412   MCV 94.4 11/18/2016 0412   MCH 32.4 11/18/2016 0412   MCHC 34.3 11/18/2016 0412   RDW 11.9 11/18/2016 0412    Toco: irritability, rare ctx FH: 120's, + accels, no decels, 10 beat var  A/P: 31 yo G1 at 33'1 with known placenta previa and 2nd bleed (pre-viable bleed at 16 wks, 1st bleed at 31 wks). - Placenta previa. Bleeding minimal at this time. 1 dose of procardia on admit, no current tocolysis. Will move to qshift and prn monitoring, T/S q 3days, keep IV access. Ok for ambulation, regular diet. - Fetal status. AGA last wk, will repeat u/s next week. S/p BMZ, never received Mag. S/p NICU consult - Prophylaxis, iron, protonix, colace, SCDs - Dispo. In house for now, probably until delivery, will plan PCS in 36 th week.   Melanie Munoz A. 11/19/2016 9:02 AM

## 2016-11-19 NOTE — Progress Notes (Signed)
UR chart review completed.  

## 2016-11-20 NOTE — Progress Notes (Signed)
HD # 3- placenta previa, 33.2 wks. S/p BTMZ. 2nd bleed  S: . No ctx, no LOF, good FM. No CP or SOB.Brown discharge only this AM.  PE:  Vitals:   11/19/16 1538 11/19/16 2045 11/19/16 2346 11/20/16 0400  BP: 108/68  98/63 (!) 97/58  Pulse: 92  95 87  Resp: 16 16 18 16   Temp: 98.6 F (37 C) 98.4 F (36.9 C) 98.3 F (36.8 C) 97.8 F (36.6 C)  TempSrc: Oral Oral Oral Oral  SpO2: 96%  99% 98%  Weight:    180 lb (81.6 kg)  Height:       Gen: well appearing, no distress CV: RRR Pulm: CTAB Abd: soft, gravid, NT, no fundal tenderness LE: NT, no edema  NST-   Toco: rare ctx FH: 120's, + accels, no decels, 10 beat var  A/P: 31 yo G1 at 4233' 2 with known placenta previa and 2nd bleed (pre-viable bleed at 16 wks, 1st bleed at 31 wks). - Placenta previa. Bleeding minimal at this time. 1 dose of procardia on admit, no current tocolysis.  Continue qshift and prn monitoring, T/S q 3days, keep IV access. Ok for ambulation, regular diet. SCDs in bed.  - Fetal status. AGA last wk, will repeat u/s next week. S/p BMZ, never received Mag. S/p NICU consult - Prophylaxis, iron, protonix, colace, SCDs - Dispo. In house until delivery, will plan PCS in 36 th week.   Jonica Bickhart R 11/20/2016 7:57 AM

## 2016-11-21 ENCOUNTER — Other Ambulatory Visit: Payer: Self-pay | Admitting: Obstetrics

## 2016-11-21 LAB — TYPE AND SCREEN
ABO/RH(D): A POS
ANTIBODY SCREEN: NEGATIVE

## 2016-11-21 NOTE — Progress Notes (Signed)
Report given Ileene Rubensiana Mensah, RN.

## 2016-11-21 NOTE — Progress Notes (Signed)
S: D4 : placenta previa  33 3/7 weeks  Denies any further vaginal bleeding since last night (-) ctx   O: BP 107/64 (BP Location: Right Arm)   Pulse 89   Temp 98.9 F (37.2 C) (Oral)   Resp 16   Ht 5\' 6"  (1.676 m)   Wt 82 kg (180 lb 12 oz)   SpO2 98%   BMI 29.17 kg/m    Lungs clear to A Cor RRR Abdomen: gravid nontender Pad scant tan d/c Pelvic deferred  Tracing: baseline 125 (+) accel No ctx  IMP: Placenta Previa IUP @ 33 3/7 weeks P) cont inpt status. Per pt, C/S planned at 36 weeks

## 2016-11-22 NOTE — Progress Notes (Signed)
UR chart review completed.  

## 2016-11-22 NOTE — Progress Notes (Signed)
Patient ID: Melanie BeckAmber Munoz, female   DOB: [birth year], 31 y.o.   MRN: [id number] HD #5 33w 4d Complete Previa  S: Good FM. Still with spotting. History of increased bleeding which has decreased since admission. Denies SROM. No contractions. No SOB. No CP . No pain noted.  O: BP 118/69 (BP Location: Right Arm)   Pulse 91   Temp 98 F (36.7 C) (Oral)   Resp 16   Ht 5\' 6"  (1.676 m)   Wt 81.8 kg (180 lb 4 oz)   SpO2 95%   BMI 29.09 kg/m   WDWN WF NAD NCAT Neck: supple with FROM Lgs: CTA CV: RRR Abd: gravid and NT Neg CVAT VE: minimal brownish spotting on pad EXT: SCDs intact Neuro: nonfocal  Skin: intact  Category 1 tracing FHR 120-130, BTBV > 5, 15x15 accels, no decels, no contractions Reactive NST x 3  Sono confirms left lateral post complete previa with nl AFI CBC    Component Value Date/Time   WBC 8.2 11/18/2016 0412   RBC 3.55 (L) 11/18/2016 0412   HGB 11.5 (L) 11/18/2016 0412   HCT 33.5 (L) 11/18/2016 0412   PLT 215 11/18/2016 0412   MCV 94.4 11/18/2016 0412   MCH 32.4 11/18/2016 0412   MCHC 34.3 11/18/2016 0412   RDW 11.9 11/18/2016 0412    A: 33w 4d IUP Complete Previa- Bleeding episode #3. BMZ complete. Minimal bleeding at this time AGA by sono 5/7- reassuring FHR status  P: NST q shift Saline lock Current T&S BMZ complete Discussed timing of delivery Will need growth sono next week

## 2016-11-23 MED ORDER — SODIUM CHLORIDE 0.9% FLUSH
3.0000 mL | Freq: Two times a day (BID) | INTRAVENOUS | Status: DC
  Administered 2016-11-22 – 2016-12-03 (×18): 3 mL via INTRAVENOUS

## 2016-11-23 NOTE — Progress Notes (Signed)
HD#6  Pt felt nauseous this am, s/p phenergan and nausea gone but sleepy; denies any red blood, reports some brown with wiping, minimal in pad; no other c/o; did go in wheelchair outside yesterday Denies any leaking fluid, contractions; +FM  Temp:  [98.3 F (36.8 C)-98.9 F (37.2 C)] 98.8 F (37.1 C) (06/02 0740) Pulse Rate:  [89-105] 105 (06/02 0740) Resp:  [17-18] 17 (06/02 0740) BP: (108-114)/(66-75) 109/72 (06/02 0740) SpO2:  [97 %-99 %] 97 % (06/02 0740) Weight:  [179 lb (81.2 kg)] 179 lb (81.2 kg) (06/02 0445)   A&ox3 rrr ctab Abd: soft, nt, gravid LE: no edema, nt bilat; scds not currently on as just removed (has been using)  NSTs reviewed FHT: cat 1; 120s baseline, nml variability, +accels, no decels; all reactive in last 24 hrs Toco: no ctx  A/p: iup at 33 5/7 with complete previa (last bleeding event was 3rd in preg) 1. Stable, no new bleeding, minimal - contin to follow; plan for ltcs at 36 wga; plan for admission until delivery; follow closely for any further bleeding; contin current plan 2. Prematurity - s/p betamethasone x2 on prior admission 3. Fetal status reassuring; plan growth u/s 6/4 and order in chart; nst q shift 4. Rh pos, type and screen q 3 d

## 2016-11-24 LAB — TYPE AND SCREEN
ABO/RH(D): A POS
Antibody Screen: NEGATIVE

## 2016-11-24 NOTE — Progress Notes (Signed)
No c/o; slightly improved dark spotting, see with wiping on paper, smear on pad this am; none noted overnight Denies ctx, lof; +FM  No n/v, encourage snacks bet meals  Temp:  [97.8 F (36.6 C)-98.8 F (37.1 C)] 97.8 F (36.6 C) (06/03 1206) Pulse Rate:  [79-97] 94 (06/03 1206) Resp:  [16-18] 18 (06/03 1206) BP: (98-111)/(49-78) 111/71 (06/03 1206) SpO2:  [99 %-100 %] 99 % (06/03 1206) Weight:  [180 lb (81.6 kg)] 180 lb (81.6 kg) (06/03 0424)   A&ox3 Normal respirations Abd: soft, nt, gravid LE: no edema, nt bilat, scds in place  Type and screen done this am, rh pos  FHT: nst x3 reviewed and all reactive Baseline from 120s-150s, normal variability, +accels, no decels TOCO: no ctx  A/p: iup at 33 6 with complete previa (3 bleeding episodes this pregnancy) 1. Stable, light spotting improved; follow closely and pt aware of bleeding precautions 2. Prematurity - s/p betamethasone x2 3. Fetal status reassuring - growth u/s in 1 day; contin nst q shift

## 2016-11-25 ENCOUNTER — Inpatient Hospital Stay (HOSPITAL_COMMUNITY): Payer: BC Managed Care – PPO

## 2016-11-25 NOTE — Progress Notes (Signed)
S:  34 weeks Previa Notes some brown d/c  active fetus no ctx  O: BP 107/64 (BP Location: Right Arm)   Pulse 87   Temp 97.8 F (36.6 C) (Oral)   Resp 16   Ht 5\' 6"  (1.676 m)   Wt 81.6 kg (180 lb)   SpO2 98%   BMI 29.05 kg/m   Lungs clear to A  Cor RRR Abd: gravid soft nontender Pad scant brown extr no edema  Tracing cat 1 baseline 125 No ctx   [place] Mfm Ob Comp + 14 Wk  Result Date: 11/25/2016 ----------------------------------------------------------------------  OBSTETRICS REPORT                      (Signed Final 11/25/2016 10:42 am) ---------------------------------------------------------------------- Patient Info  ID #:       [id number]                         D.O.B.:   1985/08/28 (31 yrs)  Name:       Melanie Munoz                    Visit Date:  11/25/2016 09:41 am ---------------------------------------------------------------------- Performed By  Performed By:     Emeline Darling BS,      Ref. Address:     [facility]                    RDMS                                                             OB/GYN &                                                             Infertility                                                             [address]                                                             [place], [place]  [phone]  Attending:        Clarene CritchleyPaul W Whitecar        Secondary Phy.:   3rd Nursing- 3rd                    MD                                                             [phone]  Referred By:      Alphonsus SiasKELLY A                [facility]                    [facility] ---------------------------------------------------------------------- Orders   #  Description                                 Code   1  US MFM OB COMP + 14 WK                      76805.01  ----------------------------------------------------------------------   #   Ordered By               Order #        Accession #    Episode #   1  Noland FordyceKELLY FOGLEMAN           [id number]      [id number]     [id number]  ---------------------------------------------------------------------- Indications   [redacted] weeks gestation of pregnancy                Z3A.34   Vaginal bleeding in pregnancy, third trimester O46.93   Placenta previa with hemorrhage, third         O44.13   trimester   Encounter for antenatal screening for          Z36.3   malformations  ---------------------------------------------------------------------- OB History  Blood Type:            Height:  5'6"   Weight (lb):  181      BMI:   29.21  Gravidity:    1 ---------------------------------------------------------------------- Fetal Evaluation  Num Of Fetuses:     1  Fetal Heart         137  Rate(bpm):  Cardiac Activity:   Observed  Presentation:       Cephalic  Placenta:           Posterior Lt. Lateral Previa  P. Cord Insertion:  Not well visualized  Amniotic Fluid  AFI FV:      Subjectively within normal limits  AFI Sum(cm)     %Tile       Largest Pocket(cm)  11.86           33          4.89  RUQ(cm)                     LUQ(cm)        LLQ(cm)  3.97                        4.89  3 ---------------------------------------------------------------------- Biometry  BPD:      81.9  mm     G. Age:  33w 0d         18  %    CI:        68.46   %   70 - 86                                                          FL/HC:      20.5   %   19.4 - 21.8  HC:      316.4  mm     G. Age:  35w 4d         53  %    HC/AC:      0.98       0.96 - 1.11  AC:      321.8  mm     G. Age:  36w 1d         95  %    FL/BPD:     79.1   %   71 - 87  FL:       64.8  mm     G. Age:  33w 3d         25  %    FL/AC:      20.1   %   20 - 24  Est. FW:    2570  gm    5 lb 11 oz      77  % ---------------------------------------------------------------------- Gestational Age  Clinical EDD:  34w 0d                                        EDD:   01/06/17  U/S Today:     34w  4d                                        EDD:   01/02/17  Best:          34w 0d    Det. By:   Clinical EDD             EDD:   01/06/17 ---------------------------------------------------------------------- Anatomy  Cranium:               Appears normal         Aortic Arch:            Not well visualized  Cavum:                 Appears normal         Ductal Arch:            Not well visualized  Ventricles:            Appears normal         Diaphragm:              Not well visualized  Choroid Plexus:        Appears normal         Stomach:  Appears normal, left                                                                        sided  Cerebellum:            Appears normal         Abdomen:                Appears normal  Posterior Fossa:       Not well visualized    Abdominal Wall:         Not well visualized  Nuchal Fold:           Not applicable (>20    Cord Vessels:           Appears normal ([redacted]                         wks GA)                                        vessel cord)  Face:                  Orbits appear          Kidneys:                Appear normal                         normal  Lips:                  Appears normal         Bladder:                Appears normal  Thoracic:              Appears normal         Spine:                  Limited views                                                                        appear normal  Heart:                 Appears normal         Upper Extremities:      Not well visualized                         (4CH, axis, and situs  RVOT:                  Not well visualized    Lower Extremities:      Not well visualized  LVOT:  Not well visualized  Other:  Female gender. Complete fetal anatomic survey previously performed in          office. ---------------------------------------------------------------------- Cervix Uterus Adnexa  Cervix  Not visualized (advanced GA >29wks)  ---------------------------------------------------------------------- Impression  Single IUP at 34w 0d  Limited views of the fetal heart and spine obtained due to  advanced gestaitonal age and fetal position  The estimated fetal weight is at the 77th %tile  A complete, posterior / left lateral placenta previa is noted  Normal amniotic fluid volume ---------------------------------------------------------------------- Recommendations  Follow-up ultrasounds as clinically indicated. ----------------------------------------------------------------------                Candis Shine, MD Electronically Signed Final Report   11/25/2016 10:42 am ---------------------------------------------------------------------- IMP; Placenta previa IUP @ 34 weeks P) cont inpt mgmt. Prim C/S @ 37 week per pt

## 2016-11-26 NOTE — Progress Notes (Signed)
Patient ID: Melanie BeckAmber Cumpton, female   DOB: 01/16/1986, 31 y.o.   MRN: [id number] HD #9 34w 1d Complete Previa  S: Good FM. Still with brownish dc. History of increased bleeding which has decreased since admission. Denies SROM. No contractions. No SOB. No CP . No pain noted.  O: BP 114/68   Pulse 88   Temp 98 F (36.7 C) (Oral)   Resp 16   Ht 5\' 6"  (1.676 m)   Wt 81.6 kg (180 lb 0.1 oz)   SpO2 99%   BMI 29.05 kg/m   WDWN WF NAD NCAT Neck: supple with FROM Lgs: CTA CV: RRR Abd: gravid and NT Neg CVAT VE: minimal brownish spotting on pad EXT: SCDs intact Neuro: nonfocal  Skin: intact  Category 1 tracing FHR 120-130, BTBV > 5, 15x15 accels, no decels, no contractions Reactive NST x 3  Sono confirms left lateral post complete previa with nl AFI CBC    Component Value Date/Time   WBC 8.2 11/18/2016 0412   RBC 3.55 (L) 11/18/2016 0412   HGB 11.5 (L) 11/18/2016 0412   HCT 33.5 (L) 11/18/2016 0412   PLT 215 11/18/2016 0412   MCV 94.4 11/18/2016 0412   MCH 32.4 11/18/2016 0412   MCHC 34.3 11/18/2016 0412   RDW 11.9 11/18/2016 0412   Sono: 6/4- AGA, nl AFI, cephalic, post lateral previa A: 34w 1d IUP Complete Previa- Bleeding episode #3. BMZ complete. Minimal bleeding at this time AGA by sono 6/4- reassuring FHR status  P: NST q shift Saline lock Current T&S BMZ complete Discussed timing of delivery Will need growth sono next week

## 2016-11-27 LAB — TYPE AND SCREEN
ABO/RH(D): A POS
ANTIBODY SCREEN: NEGATIVE

## 2016-11-27 NOTE — Progress Notes (Signed)
Patient ID: Melanie Munoz, female   DOB: 10/13/1985, Melanie Beck[age] y.o.   MRN: [id number] HD #10  34w 2d Complete Previa - stable, active bleeding resolved.  BTMZx 2 and NICU consult at last admission.   S: Good FM. Has brownish discharge, especially more in the mornings. Denies contractions.  Has good FMs. No leaking fluid. No fever/ back pain/ SOB/ LE pain or swelling   O: BP 115/63 (BP Location: Right Arm)   Pulse 86   Temp 98.8 F (37.1 C) (Oral)   Resp 18   Ht 5\' 6"  (1.676 m)   Wt 180 lb 0.1 oz (81.6 kg)   SpO2 100%   BMI 29.05 kg/m   Physical exam:  A&O x 3, no acute distress. Pleasant HEENT neg Abdo soft, non tender, non acute Extr no edema/ tenderness Pelvic no bleeding   NST per shift FHT 130/ + accels/ no decels/ mod variability- reactive  Toco none   Sono 11/25/16- left lateral post complete previa, EFW 5'11" AGA. Vtx  A: 34w 2d  Complete Previa- Bleeding episode #3. BMZ complete. Minimal bleeding at this time AGA by sono 6/4- reassuring FHR status  P: NST q shift Saline lock, T&S BMZ complete C/s planned with Dr Ernestina PennaFogleman at 37 wks on 12/16/16

## 2016-11-28 NOTE — Progress Notes (Signed)
S:  34  3/7weeks Previa. BMZ complete  saw some with wipe post bathroom  Lungs clear to A  Cor RRR Abd: gravid soft nontender Pad scant brown extr no edema  Tracing  baseline 130 (+) accel to 145-150 (-)ctx    IMP; Placenta previa IUP @ 34 3/7 weeks P) cont inpt mgmt.

## 2016-11-28 NOTE — Progress Notes (Signed)
Initial Nutrition Assessment  DOCUMENTATION CODES:   Not applicable  INTERVENTION:  Regular diet May order double protein portions, snacks TID and from retail  NUTRITION DIAGNOSIS:   Increased nutrient needs related to  (pregnancy and fetal growth rerquirements) as evidenced by  (34 weeks IUP).  GOAL:  Patient will meet greater than or equal to 90% of their needs  MONITOR:  Weight trends  REASON FOR ASSESSMENT:  Antenatal   ASSESSMENT:  34 3/7 weeks placenta previa. Pre-preg weight of 155 lbs. Weight up 25 lbs. BMI 24.9. Reports good appetite  Diet Order:  Diet regular Room service appropriate? Yes; Fluid consistency: Thin  Skin:  Reviewed, no issues  Height:   Ht Readings from Last 1 Encounters:  11/18/16 5\' 6"  (1.676 m)    Weight:   Wt Readings from Last 1 Encounters:  11/28/16 181 lb 0.6 oz (82.1 kg)    Ideal Body Weight:   130 lbs  BMI:  Body mass index is 29.22 kg/m.  Estimated Nutritional Needs:   Kcal:  1900-2100  Protein:  85-95 g  Fluid:  2.2 L  EDUCATION NEEDS:   No education needs identified at this time  Inez PilgrimKatherine Aldin Drees M.Odis LusterEd. R.D. LDN Neonatal Nutrition Support Specialist/RD III Pager [phone]      Phone [phone]

## 2016-11-29 MED ORDER — DOCUSATE SODIUM 100 MG PO CAPS
100.0000 mg | ORAL_CAPSULE | ORAL | Status: DC
  Administered 2016-12-01 – 2016-12-03 (×2): 100 mg via ORAL
  Filled 2016-11-29 (×3): qty 1

## 2016-11-29 NOTE — Progress Notes (Signed)
HD 12  Pt w/o c/o, denies any contractions, lof, ctx +FM Reports some days w/o any spotting, other days with light brown with wiping or in am with spot on pad; none noted today  BP 113/66 (BP Location: Right Arm)   Pulse 95   Temp 98.2 F (36.8 C) (Oral)   Resp 16   Ht 5\' 6"  (1.676 m)   Wt 182 lb (82.6 kg)   SpO2 98%   BMI 29.38 kg/m    A&ox3 Rrr, ctab Abd: soft, nt, gravid LE: no edema, nt bilat; +scds bilat  NST (x3): category 1, reactive; baseline 120s-130s, +accels, no decels; no ctx  A/P: iup at 34 4 with complete previa s/p bleeding episodes x3 1. Stable, minimal spotting - follow closely; contin care; t/s q 3 days 2. Fetal status reassuring, contin nst q shift 3. Prematurity - s/p betamethasone x2 4. C/s planned at 37 wga

## 2016-11-30 LAB — TYPE AND SCREEN
ABO/RH(D): A POS
ANTIBODY SCREEN: NEGATIVE

## 2016-11-30 NOTE — Progress Notes (Signed)
Hospital day 13  Patient notes some mild sinus congestion but no significant headache. Good fetal movement, no contractions, no vaginal bleeding. No leakage of fluid.  Vitals:   11/30/16 0604 11/30/16 0823 11/30/16 0824 11/30/16 1153  BP:   93/67 113/71  Pulse:   (!) 124 92  Resp:    18  Temp:   98.4 F (36.9 C) 98.7 F (37.1 C)  TempSrc:   Oral Oral  SpO2: 97% 98% 98% 98%  Weight: 181 lb (82.1 kg)     Height:       Gen.: Well-appearing, no distress Abdomen: Gravid, nontender, no fundal tenderness GU: No staining Lower extremity: Nontender, no edema  Toco: No contractions, occasional irritability FH: 145, positive accelerations, no decelerations, 10 beat variability  Assessment and plan: 31 year old G1 at 34 weeks and 5 days with complete previa status post 3 bleeding episodes Placenta previa. Stable. Continue in-house monitoring. Will plan for scheduled primary cesarean section at 37 weeks. May advance her C-section should change in maternal or fetal status occur. Patient is aware that should she bleed she will likely be having her C-section early.  Prematurity. Status post single course of betamethasone with 2 shots, given at 31 weeks. We discussed the possibility of a rescue dose of betamethasone at this time however given the limited benefit is betamethasone greater than 34 weeks and the lack of studies on a rescue dose after 34 weeks we have decided against this management strategy.  GBS unknown. We are planning cesarean section with Ancef surgical prophylaxis. No indication to check group B strep  Rubella nonimmune. Plan MMR postpartum  Melanie Munoz A. 11/30/2016 3:30 PM

## 2016-12-01 NOTE — Progress Notes (Signed)
Hospital day 14  . Good fetal movement, no contractions, no vaginal bleeding.  Some brown spotting with wiping. No leakage of fluid.  Vitals:   11/30/16 2021 12/01/16 0500 12/01/16 0557 12/01/16 0800  BP: (!) 108/59  115/65 108/68  Pulse: 88  83 100  Resp: '18  16 18  ' Temp: 98.3 F (36.8 C)  98.6 F (37 C) 98.2 F (36.8 C)  TempSrc:   Oral Oral  SpO2: 98%  97% 98%  Weight:  182 lb 2 oz (82.6 kg)    Height:       Gen.: Well-appearing, no distress Abdomen: Gravid, nontender, no fundal tenderness GU: No staining Lower extremity: Nontender, no edema  Toco: No contractions, occasional irritability FH: 135, positive accelerations, no decelerations, 10 beat variability  Assessment and plan: 31 year old G1 at 34 weeks and 6 days with complete previa status post 3 bleeding episodes Placenta previa. Stable. Continue in-house monitoring. Will plan for scheduled primary cesarean section at 37 weeks. May advance her C-section should change in maternal or fetal status occur. Patient is aware that should she bleed she will likely be having her C-section early.  Prematurity. Status post single course of betamethasone with 2 shots, given at 31 weeks. We discussed the possibility of a rescue dose of betamethasone at this time however given the limited benefit is betamethasone greater than 34 weeks and the lack of studies on a rescue dose after 34 weeks we have decided against this management strategy.  GBS unknown. We are planning cesarean section with Ancef surgical prophylaxis. No indication to check group B strep  Rubella nonimmune. Plan MMR postpartum  Lorita Forinash A. 12/01/2016 10:22 AM

## 2016-12-02 NOTE — Progress Notes (Signed)
Patient ID: Melanie BeckAmber Munoz, female   DOB: 05-Aug-1985, 31 y.o.   MRN: [id number] 35 wks Complete Previa - stable  BTMZx 2, NICU consult at 31 wk admission.  Scheduled C/s at 37 wks.   S: Good FM. Has brownish discharge in mornings. Denies contractions.  Has good FMs. No leaking fluid. No fever/ back pain/ SOB/ LE pain or swelling   O: BP 111/66 (BP Location: Left Arm)   Pulse 83   Temp 98.9 F (37.2 C) (Oral)   Resp 18   Ht 5\' 6"  (1.676 m)   Wt 181 lb 12 oz (82.4 kg)   SpO2 100%   BMI 29.34 kg/m    A&O x 3, no acute distress. Pleasant HEENT neg Abdo soft, non tender, non acute Extr no edema/ tenderness Pelvic no bleeding   NST per shift FHT 130/ + accels/ no decels/ mod variability- reactive  Toco none   Sono 11/25/16- left lateral post complete previa, EFW 5'11" AGA. Vtx  A: 35 wks  Complete Previa- Bleeding episode #3. BMZ complete. Minimal bleeding at this time AGA by sono 6/4- reassuring FHR status  P: NST q shift Saline lock, T&S BMZ complete C/s planned with Dr Ernestina PennaFogleman at 37 wks on 12/16/16 Remains inpatient.   V.Haruye Lainez, MD

## 2016-12-03 ENCOUNTER — Inpatient Hospital Stay (HOSPITAL_COMMUNITY): Payer: BC Managed Care – PPO | Admitting: Anesthesiology

## 2016-12-03 ENCOUNTER — Encounter (HOSPITAL_COMMUNITY)
Admission: AD | Disposition: A | Discharge status: Home / Self Care | Payer: Self-pay | Source: Ambulatory Visit | Attending: Obstetrics

## 2016-12-03 ENCOUNTER — Encounter (HOSPITAL_COMMUNITY): Payer: Self-pay | Admitting: *Deleted

## 2016-12-03 DIAGNOSIS — Z98891 History of uterine scar from previous surgery: Secondary | ICD-10-CM

## 2016-12-03 LAB — PROTIME-INR
INR: 1.07
Prothrombin Time: 13.9 seconds (ref 11.4–15.2)

## 2016-12-03 LAB — HEMOGLOBIN AND HEMATOCRIT, BLOOD
HEMATOCRIT: 29.4 % — AB (ref 36.0–46.0)
HEMOGLOBIN: 10.1 g/dL — AB (ref 12.0–15.0)

## 2016-12-03 LAB — TYPE AND SCREEN
ABO/RH(D): A POS
Antibody Screen: NEGATIVE

## 2016-12-03 LAB — PLATELET COUNT: Platelets: 139 10*3/uL — ABNORMAL LOW (ref 150–400)

## 2016-12-03 LAB — APTT: aPTT: 29 seconds (ref 24–36)

## 2016-12-03 LAB — FIBRINOGEN: FIBRINOGEN: 371 mg/dL (ref 210–475)

## 2016-12-03 SURGERY — Surgical Case
Anesthesia: Spinal | Site: Abdomen | Wound class: Clean Contaminated

## 2016-12-03 MED ORDER — SIMETHICONE 80 MG PO CHEW
80.0000 mg | CHEWABLE_TABLET | Freq: Three times a day (TID) | ORAL | Status: DC
  Administered 2016-12-04 – 2016-12-05 (×6): 80 mg via ORAL
  Filled 2016-12-03 (×6): qty 1

## 2016-12-03 MED ORDER — PRENATAL MULTIVITAMIN CH
1.0000 | ORAL_TABLET | Freq: Every day | ORAL | Status: DC
  Administered 2016-12-04 – 2016-12-05 (×2): 1 via ORAL
  Filled 2016-12-03 (×2): qty 1

## 2016-12-03 MED ORDER — ONDANSETRON HCL 4 MG/2ML IJ SOLN
INTRAMUSCULAR | Status: DC | PRN
  Administered 2016-12-03: 4 mg via INTRAVENOUS

## 2016-12-03 MED ORDER — DEXAMETHASONE SODIUM PHOSPHATE 4 MG/ML IJ SOLN
INTRAMUSCULAR | Status: DC | PRN
  Administered 2016-12-03: 10 mg via INTRAVENOUS

## 2016-12-03 MED ORDER — CEFAZOLIN SODIUM-DEXTROSE 2-4 GM/100ML-% IV SOLN
INTRAVENOUS | Status: AC
  Filled 2016-12-03: qty 100

## 2016-12-03 MED ORDER — BUPIVACAINE IN DEXTROSE 0.75-8.25 % IT SOLN
INTRATHECAL | Status: AC
  Filled 2016-12-03: qty 2

## 2016-12-03 MED ORDER — OXYTOCIN 10 UNIT/ML IJ SOLN
INTRAMUSCULAR | Status: AC
  Filled 2016-12-03: qty 4

## 2016-12-03 MED ORDER — SOD CITRATE-CITRIC ACID 500-334 MG/5ML PO SOLN
ORAL | Status: AC
  Filled 2016-12-03: qty 15

## 2016-12-03 MED ORDER — PHENYLEPHRINE 40 MCG/ML (10ML) SYRINGE FOR IV PUSH (FOR BLOOD PRESSURE SUPPORT)
PREFILLED_SYRINGE | INTRAVENOUS | Status: AC
  Filled 2016-12-03: qty 10

## 2016-12-03 MED ORDER — DEXAMETHASONE SODIUM PHOSPHATE 10 MG/ML IJ SOLN
INTRAMUSCULAR | Status: AC
  Filled 2016-12-03: qty 1

## 2016-12-03 MED ORDER — NALBUPHINE HCL 10 MG/ML IJ SOLN: 5.0000 mg | Freq: Once | INTRAMUSCULAR | Status: DC | PRN

## 2016-12-03 MED ORDER — SCOPOLAMINE 1 MG/3DAYS TD PT72
MEDICATED_PATCH | TRANSDERMAL | Status: DC | PRN
  Administered 2016-12-03: 1 via TRANSDERMAL

## 2016-12-03 MED ORDER — SENNOSIDES-DOCUSATE SODIUM 8.6-50 MG PO TABS
2.0000 | ORAL_TABLET | ORAL | Status: DC
  Administered 2016-12-03 – 2016-12-04 (×2): 2 via ORAL
  Filled 2016-12-03 (×3): qty 2

## 2016-12-03 MED ORDER — TRIAMCINOLONE ACETONIDE 40 MG/ML IJ SUSP: 40.0000 mg | Freq: Once | INTRAMUSCULAR | Status: DC

## 2016-12-03 MED ORDER — ONDANSETRON HCL 4 MG/2ML IJ SOLN: 4.0000 mg | Freq: Three times a day (TID) | INTRAMUSCULAR | Status: DC | PRN

## 2016-12-03 MED ORDER — LACTATED RINGERS IV SOLN
INTRAVENOUS | Status: DC | PRN
  Administered 2016-12-03: 15:00:00 via INTRAVENOUS

## 2016-12-03 MED ORDER — PHENYLEPHRINE HCL 10 MG/ML IJ SOLN
INTRAMUSCULAR | Status: DC | PRN
  Administered 2016-12-03 (×3): 80 ug via INTRAVENOUS

## 2016-12-03 MED ORDER — MENTHOL 3 MG MT LOZG: 1.0000 | LOZENGE | OROMUCOSAL | Status: DC | PRN

## 2016-12-03 MED ORDER — TRIAMCINOLONE ACETONIDE 40 MG/ML IJ SUSP
40.0000 mg | Freq: Once | INTRAMUSCULAR | Status: DC
  Filled 2016-12-03: qty 1

## 2016-12-03 MED ORDER — KETOROLAC TROMETHAMINE 30 MG/ML IJ SOLN: 30.0000 mg | Freq: Four times a day (QID) | INTRAMUSCULAR | Status: AC | PRN

## 2016-12-03 MED ORDER — NALOXONE HCL 2 MG/2ML IJ SOSY: 1.0000 ug/kg/h | PREFILLED_SYRINGE | INTRAMUSCULAR | Status: DC | PRN

## 2016-12-03 MED ORDER — TRIAMCINOLONE ACETONIDE 40 MG/ML IJ SUSP
INTRAMUSCULAR | Status: DC | PRN
  Administered 2016-12-03: 40 mg via INTRAMUSCULAR

## 2016-12-03 MED ORDER — SCOPOLAMINE 1 MG/3DAYS TD PT72
MEDICATED_PATCH | TRANSDERMAL | Status: AC
  Filled 2016-12-03: qty 1

## 2016-12-03 MED ORDER — SODIUM CHLORIDE 0.9 % IR SOLN
Status: DC | PRN
  Administered 2016-12-03: 1000 mL

## 2016-12-03 MED ORDER — PROMETHAZINE HCL 25 MG/ML IJ SOLN: 6.2500 mg | INTRAMUSCULAR | Status: DC | PRN

## 2016-12-03 MED ORDER — TETANUS-DIPHTH-ACELL PERTUSSIS 5-2.5-18.5 LF-MCG/0.5 IM SUSP: 0.5000 mL | Freq: Once | INTRAMUSCULAR | Status: DC

## 2016-12-03 MED ORDER — ACETAMINOPHEN 325 MG PO TABS: 650.0000 mg | ORAL_TABLET | ORAL | Status: DC | PRN

## 2016-12-03 MED ORDER — MORPHINE SULFATE (PF) 0.5 MG/ML IJ SOLN
INTRAMUSCULAR | Status: AC
  Filled 2016-12-03: qty 10

## 2016-12-03 MED ORDER — OXYCODONE HCL 5 MG/5ML PO SOLN: 5.0000 mg | Freq: Once | ORAL | Status: DC | PRN

## 2016-12-03 MED ORDER — MEPERIDINE HCL 25 MG/ML IJ SOLN: 6.2500 mg | INTRAMUSCULAR | Status: DC | PRN

## 2016-12-03 MED ORDER — NALBUPHINE HCL 10 MG/ML IJ SOLN: 5.0000 mg | INTRAMUSCULAR | Status: DC | PRN

## 2016-12-03 MED ORDER — SODIUM CHLORIDE 0.9% FLUSH: 3.0000 mL | INTRAVENOUS | Status: DC | PRN

## 2016-12-03 MED ORDER — COCONUT OIL OIL
1.0000 "application " | TOPICAL_OIL | Status: DC | PRN
  Administered 2016-12-05: 1 via TOPICAL
  Filled 2016-12-03: qty 120

## 2016-12-03 MED ORDER — NALOXONE HCL 0.4 MG/ML IJ SOLN: 0.4000 mg | INTRAMUSCULAR | Status: DC | PRN

## 2016-12-03 MED ORDER — DIPHENHYDRAMINE HCL 50 MG/ML IJ SOLN
12.5000 mg | INTRAMUSCULAR | Status: DC | PRN
Start: 2016-12-03 — End: 2016-12-06

## 2016-12-03 MED ORDER — SIMETHICONE 80 MG PO CHEW
80.0000 mg | CHEWABLE_TABLET | ORAL | Status: DC
  Administered 2016-12-03 – 2016-12-05 (×3): 80 mg via ORAL
  Filled 2016-12-03 (×3): qty 1

## 2016-12-03 MED ORDER — SCOPOLAMINE 1 MG/3DAYS TD PT72: 1.0000 | MEDICATED_PATCH | Freq: Once | TRANSDERMAL | Status: DC

## 2016-12-03 MED ORDER — ZOLPIDEM TARTRATE 5 MG PO TABS: 5.0000 mg | ORAL_TABLET | Freq: Every evening | ORAL | Status: DC | PRN

## 2016-12-03 MED ORDER — MORPHINE SULFATE (PF) 0.5 MG/ML IJ SOLN
INTRAMUSCULAR | Status: DC | PRN
  Administered 2016-12-03: .2 mg via INTRATHECAL

## 2016-12-03 MED ORDER — METOCLOPRAMIDE HCL 5 MG/ML IJ SOLN
INTRAMUSCULAR | Status: AC
  Filled 2016-12-03: qty 2

## 2016-12-03 MED ORDER — ONDANSETRON HCL 4 MG/2ML IJ SOLN
INTRAMUSCULAR | Status: AC
  Filled 2016-12-03: qty 2

## 2016-12-03 MED ORDER — PHENYLEPHRINE 8 MG IN D5W 100 ML (0.08MG/ML) PREMIX OPTIME
INJECTION | INTRAVENOUS | Status: DC | PRN
  Administered 2016-12-03: 60 ug/min via INTRAVENOUS

## 2016-12-03 MED ORDER — IBUPROFEN 600 MG PO TABS
600.0000 mg | ORAL_TABLET | Freq: Four times a day (QID) | ORAL | Status: DC
  Administered 2016-12-03 – 2016-12-06 (×10): 600 mg via ORAL
  Filled 2016-12-03 (×10): qty 1

## 2016-12-03 MED ORDER — DIBUCAINE 1 % RE OINT: 1.0000 "application " | TOPICAL_OINTMENT | RECTAL | Status: DC | PRN

## 2016-12-03 MED ORDER — FENTANYL CITRATE (PF) 100 MCG/2ML IJ SOLN
INTRAMUSCULAR | Status: DC | PRN
  Administered 2016-12-03: 40 ug via INTRAVENOUS
  Administered 2016-12-03: 10 ug via INTRAVENOUS

## 2016-12-03 MED ORDER — MEPERIDINE HCL 25 MG/ML IJ SOLN
INTRAMUSCULAR | Status: DC | PRN
  Administered 2016-12-03 (×2): 12.5 mg via INTRAVENOUS

## 2016-12-03 MED ORDER — SIMETHICONE 80 MG PO CHEW: 80.0000 mg | CHEWABLE_TABLET | ORAL | Status: DC | PRN

## 2016-12-03 MED ORDER — DIPHENHYDRAMINE HCL 25 MG PO CAPS: 25.0000 mg | ORAL_CAPSULE | ORAL | Status: DC | PRN

## 2016-12-03 MED ORDER — OXYCODONE HCL 5 MG PO TABS: 5.0000 mg | ORAL_TABLET | Freq: Once | ORAL | Status: DC | PRN

## 2016-12-03 MED ORDER — DIPHENHYDRAMINE HCL 25 MG PO CAPS: 25.0000 mg | ORAL_CAPSULE | Freq: Four times a day (QID) | ORAL | Status: DC | PRN

## 2016-12-03 MED ORDER — OXYTOCIN 10 UNIT/ML IJ SOLN
INTRAVENOUS | Status: DC | PRN
  Administered 2016-12-03: 40 [IU] via INTRAVENOUS

## 2016-12-03 MED ORDER — FENTANYL CITRATE (PF) 100 MCG/2ML IJ SOLN
INTRAMUSCULAR | Status: AC
  Filled 2016-12-03: qty 2

## 2016-12-03 MED ORDER — BUPIVACAINE HCL (PF) 0.25 % IJ SOLN
INTRAMUSCULAR | Status: DC | PRN
  Administered 2016-12-03: 10 mL

## 2016-12-03 MED ORDER — BUPIVACAINE IN DEXTROSE 0.75-8.25 % IT SOLN
INTRATHECAL | Status: DC | PRN
  Administered 2016-12-03: 1.6 mL via INTRATHECAL

## 2016-12-03 MED ORDER — LACTATED RINGERS IV SOLN
INTRAVENOUS | Status: DC | PRN
  Administered 2016-12-03 (×3): via INTRAVENOUS

## 2016-12-03 MED ORDER — BUPIVACAINE HCL (PF) 0.25 % IJ SOLN
INTRAMUSCULAR | Status: AC
  Filled 2016-12-03: qty 10

## 2016-12-03 MED ORDER — WITCH HAZEL-GLYCERIN EX PADS: 1.0000 "application " | MEDICATED_PAD | CUTANEOUS | Status: DC | PRN

## 2016-12-03 MED ORDER — OXYTOCIN 40 UNITS IN LACTATED RINGERS INFUSION - SIMPLE MED: 2.5000 [IU]/h | INTRAVENOUS | Status: AC

## 2016-12-03 MED ORDER — CEFAZOLIN SODIUM-DEXTROSE 2-4 GM/100ML-% IV SOLN
2.0000 g | Freq: Three times a day (TID) | INTRAVENOUS | Status: DC
  Administered 2016-12-03: 2 g via INTRAVENOUS
  Filled 2016-12-03 (×2): qty 100

## 2016-12-03 MED ORDER — MEPERIDINE HCL 25 MG/ML IJ SOLN
INTRAMUSCULAR | Status: AC
  Filled 2016-12-03: qty 1

## 2016-12-03 MED ORDER — METOCLOPRAMIDE HCL 5 MG/ML IJ SOLN
INTRAMUSCULAR | Status: DC | PRN
  Administered 2016-12-03: 10 mg via INTRAVENOUS

## 2016-12-03 MED ORDER — HYDROMORPHONE HCL 1 MG/ML IJ SOLN: 0.2500 mg | INTRAMUSCULAR | Status: DC | PRN

## 2016-12-03 MED ORDER — LACTATED RINGERS IV SOLN
INTRAVENOUS | Status: DC
  Administered 2016-12-03: 22:00:00 via INTRAVENOUS

## 2016-12-03 SURGICAL SUPPLY — 34 items
BENZOIN TINCTURE PRP APPL 2/3 (GAUZE/BANDAGES/DRESSINGS) ×2 IMPLANT
CHLORAPREP W/TINT 26ML (MISCELLANEOUS) ×2 IMPLANT
CLAMP CORD UMBIL (MISCELLANEOUS) ×2 IMPLANT
CLOTH BEACON ORANGE TIMEOUT ST (SAFETY) ×2 IMPLANT
DRSG OPSITE POSTOP 4X10 (GAUZE/BANDAGES/DRESSINGS) ×2 IMPLANT
ELECT REM PT RETURN 9FT ADLT (ELECTROSURGICAL) ×2
ELECTRODE REM PT RTRN 9FT ADLT (ELECTROSURGICAL) ×1 IMPLANT
EXTRACTOR VACUUM M CUP 4 TUBE (SUCTIONS) ×2 IMPLANT
GLOVE BIO SURGEON STRL SZ 6.5 (GLOVE) ×2 IMPLANT
GLOVE BIOGEL PI IND STRL 7.0 (GLOVE) ×2 IMPLANT
GLOVE BIOGEL PI INDICATOR 7.0 (GLOVE) ×2
GOWN STRL REUS W/TWL LRG LVL3 (GOWN DISPOSABLE) ×4 IMPLANT
NDL SAFETY ECLIPSE 18X1.5 (NEEDLE) ×1 IMPLANT
NEEDLE HYPO 18GX1.5 SHARP (NEEDLE) ×1
NEEDLE HYPO 22GX1.5 SAFETY (NEEDLE) ×2 IMPLANT
NS IRRIG 1000ML POUR BTL (IV SOLUTION) ×2 IMPLANT
PACK C SECTION WH (CUSTOM PROCEDURE TRAY) ×2 IMPLANT
PAD OB MATERNITY 4.3X12.25 (PERSONAL CARE ITEMS) ×2 IMPLANT
PENCIL SMOKE EVAC W/HOLSTER (ELECTROSURGICAL) ×2 IMPLANT
STRIP CLOSURE SKIN 1/2X4 (GAUZE/BANDAGES/DRESSINGS) ×2 IMPLANT
SUT MON AB 4-0 PS1 27 (SUTURE) ×2 IMPLANT
SUT PLAIN 2 0 (SUTURE) ×1
SUT PLAIN ABS 2-0 CT1 27XMFL (SUTURE) ×1 IMPLANT
SUT VIC AB 0 CT1 27 (SUTURE) ×1
SUT VIC AB 0 CT1 27XBRD ANBCTR (SUTURE) ×1 IMPLANT
SUT VIC AB 0 CT1 36 (SUTURE) ×6 IMPLANT
SUT VIC AB 0 CTX 36 (SUTURE) ×4
SUT VIC AB 0 CTX36XBRD ANBCTRL (SUTURE) ×4 IMPLANT
SUT VIC AB 2-0 CT1 (SUTURE) ×4 IMPLANT
SUT VIC AB 2-0 CT1 27 (SUTURE) ×1
SUT VIC AB 2-0 CT1 TAPERPNT 27 (SUTURE) ×1 IMPLANT
SYR 10ML LL (SYRINGE) ×4 IMPLANT
TOWEL OR 17X24 6PK STRL BLUE (TOWEL DISPOSABLE) ×2 IMPLANT
TRAY FOLEY BAG SILVER LF 14FR (SET/KITS/TRAYS/PACK) ×2 IMPLANT

## 2016-12-03 NOTE — Progress Notes (Signed)
Pt denies any contractions, LOF, no bright red bleeding; states no change in her brown/clear discharge - notes with wiping and then streak in pad usually in am Did walk outside for about 10min yesterday  Patient Vitals for the past 24 hrs:  BP Temp Temp src Pulse Resp SpO2 Weight  12/03/16 0742 (!) 102/59 98.6 F (37 C) Oral 87 18 97 % -  12/03/16 0606 102/65 98.5 F (36.9 C) Oral 60 18 96 % 183 lb (83 kg)  12/02/16 2053 103/65 98.3 F (36.8 C) Oral 84 18 98 % -  12/02/16 1600 106/63 99 F (37.2 C) Oral 90 18 99 % -  12/02/16 1201 120/72 98.8 F (37.1 C) Oral 97 17 99 % -    A&ox3 nml respirations Abd: soft, nt, gravid LE: no edema, nt bilat  NST: nsts reviewed over last 24 hrs and reactive/category 1; baseline 120-130s, nml variability, +accels, no decels Toco: no ctx  6/4 u/s: left lateral posterior complete previa, efw 5'11", AGA, vtx  A/P: iup at 35 1 with complete previa, s/p 3 bleeding episisodes 1. Stable scant old blood, no active bleeding; contin to follow; 1ltcs planned at 37 wga; contin current care; type/screen q 3 days 2. Fetal status reassuring, contin nst q shift 3. Prematurity: s/p betamethasone

## 2016-12-03 NOTE — Brief Op Note (Signed)
11/18/2016 - 12/03/2016  5:39 PM  PATIENT:  Melanie Munoz  31 y.o. female  PRE-OPERATIVE DIAGNOSIS:  placenta previa  POST-OPERATIVE DIAGNOSIS:  placenta previa  PROCEDURE:  Procedure(s): CESAREAN SECTION (N/A)  Primary LCTS with 2 layer closure  SURGEON:  Surgeon(s) and Role:    * [name], Kjersti Dittmer, MD - Primary  PHYSICIAN ASSISTANT:   ASSISTANTS: Sigmon, CNM   ANESTHESIA:   spinal  EBL:  Total I/O In: 3400 [I.V.:3400] Out: 1600 [Urine:200; Blood:1400]  BLOOD ADMINISTERED:none  DRAINS: Urinary Catheter (Foley)   LOCAL MEDICATIONS USED:  OTHER 40 units Kenalog mixed in 10cc   SPECIMEN:  Source of Specimen:  placenta  DISPOSITION OF SPECIMEN:  L&D  COUNTS:  YES  TOURNIQUET:  * No tourniquets in log *  DICTATION: .Note written in EPIC  PLAN OF CARE: Admit to inpatient   PATIENT DISPOSITION:  PACU - hemodynamically stable.   Delay start of Pharmacological VTE agent (>24hrs) due to surgical blood loss or risk of bleeding: yes

## 2016-12-03 NOTE — Anesthesia Procedure Notes (Signed)
Spinal  Patient location during procedure: OB Start time: 12/03/2016 3:13 PM End time: 12/03/2016 3:18 PM Staffing Anesthesiologist: Anitra LauthMILLER, Dechelle Attaway RAY Performed: anesthesiologist  Preanesthetic Checklist Completed: patient identified, surgical consent, pre-op evaluation, timeout performed, IV checked, risks and benefits discussed and monitors and equipment checked Spinal Block Patient position: sitting Prep: Betadine and site prepped and draped Patient monitoring: heart rate, cardiac monitor, continuous pulse ox and blood pressure Approach: midline Location: L3-4 Injection technique: single-shot Needle Needle type: Pencan  Needle gauge: 24 G Needle length: 10 cm Assessment Sensory level: T4

## 2016-12-03 NOTE — Transfer of Care (Signed)
Immediate Anesthesia Transfer of Care Note  Patient: Melanie Munoz, Melanie Munoz  Procedure(s) Performed: Procedure(s): CESAREAN SECTION (N/A)  Patient Location: PACU  Anesthesia Type:Spinal  Level of Consciousness: awake, alert , oriented and patient cooperative  Airway & Oxygen Therapy: Patient Spontanous Breathing  Post-op Assessment: Report given to RN and Post -op Vital signs reviewed and stable  Post vital signs: Reviewed and stable  Last Vitals:  Vitals:   12/03/16 0742 12/03/16 1208  BP: (!) 102/59 113/74  Pulse: 87 92  Resp: 18 18  Temp: 37 C 37.2 C    Last Pain:  Vitals:   12/03/16 1400  TempSrc:   PainSc: Asleep      Patients Stated Pain Goal: 3 (11/28/16 2021)  Complications: No apparent anesthesia complications

## 2016-12-03 NOTE — Anesthesia Preprocedure Evaluation (Signed)
Anesthesia Evaluation  Patient identified by MRN, date of birth, ID band Patient awake    Reviewed: Allergy & Precautions, NPO status , Patient's Chart, lab work & pertinent test results  Airway Mallampati: II  TM Distance: >3 FB Neck ROM: Full    Dental no notable dental hx.    Pulmonary neg pulmonary ROS,    Pulmonary exam normal breath sounds clear to auscultation       Cardiovascular negative cardio ROS Normal cardiovascular exam Rhythm:Regular Rate:Normal     Neuro/Psych negative neurological ROS  negative psych ROS   GI/Hepatic negative GI ROS, Neg liver ROS,   Endo/Other  negative endocrine ROS  Renal/GU negative Renal ROS  negative genitourinary   Musculoskeletal negative musculoskeletal ROS (+)   Abdominal   Peds negative pediatric ROS (+)  Hematology negative hematology ROS (+) anemia ,   Anesthesia Other Findings   Reproductive/Obstetrics negative OB ROS (+) Pregnancy                             Anesthesia Physical Anesthesia Plan  ASA: II and emergent  Anesthesia Plan: Spinal   Post-op Pain Management:    Induction:   PONV Risk Score and Plan: Treatment may vary due to age or medical condition, Scopolamine patch - Pre-op, Dexamethasone and Ondansetron  Airway Management Planned: Natural Airway  Additional Equipment:   Intra-op Plan:   Post-operative Plan:   Informed Consent: I have reviewed the patients History and Physical, chart, labs and discussed the procedure including the risks, benefits and alternatives for the proposed anesthesia with the patient or authorized representative who has indicated his/her understanding and acceptance.     Plan Discussed with: CRNA  Anesthesia Plan Comments:         Anesthesia Quick Evaluation

## 2016-12-03 NOTE — Op Note (Signed)
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 female  PRE-OPERATIVE DIAGNOSIS:  placenta previa  POST-OPERATIVE DIAGNOSIS:  placenta previa  PROCEDURE:  Procedure(s): CESAREAN SECTION (N/A)  Primary LCTS with 2 layer closure  SURGEON:  Surgeon(s) and Role:    * Noland Fordyce, MD - Primary  PHYSICIAN ASSISTANT:   ASSISTANTS: Sigmon, CNM   ANESTHESIA:   spinal  EBL:  Total I/O In: 3400 [I.V.:3400] Out: 1600 [Urine:200; Blood:1400]  BLOOD ADMINISTERED:none  DRAINS: Urinary Catheter (Foley)   LOCAL MEDICATIONS USED:  OTHER 40 units Kenalog mixed in 10cc   SPECIMEN:  Source of Specimen:  placenta  DISPOSITION OF SPECIMEN:  L&D  COUNTS:  YES  TOURNIQUET:  * No tourniquets in log *  DICTATION: .Note written in EPIC  PLAN OF CARE: Admit to inpatient   PATIENT DISPOSITION:  PACU - hemodynamically stable.   Delay start of Pharmacological VTE agent (>24hrs) due to surgical blood loss or risk of bleeding: yes   Findings:  @BABYSEXEBC @ infant,  APGAR (1 MIN): 8   APGAR (5 MINS): 9   APGAR (10 MINS):   Normal uterus, tubes and ovaries, normal placenta. 3VC, clear amniotic fluid  EBL: 1300 cc Antibiotics:   2g Ancef Complications: none  Indications: This is a 31 y.o. year-old, G1  At [redacted]w[redacted]d admitted for 3rd bleeding with known placenta previa. Pt had been stable on antepartum for several weeks and had sudden onset heavy vaginal bleeding. Given the amount of bleeding, fact that pt was >35 wks, decision made to proceed with urgent c/s.  Risks benefits and alternatives of the procedure were discussed with the patient who agreed to proceed  Procedure:  After informed consent was obtained the patient was taken to the operating room where spinal anesthesia was initiated.  She was prepped and draped quickly with betadine in dorsal supine position with a leftward tilt.  A foley catheter was placed.  A Pfannenstiel skin incision was made 1 cm above the pubic symphysis in the  midline with the scalpel.  Dissection was carried down with the Bovie cautery until the fascia was reached. The fascia was incised in the midline. The incision was extended laterally with the Mayo scissors. The inferior aspect of the fascial incision was grasped with the Coker clamps, elevated up and the underlying rectus muscles were dissected off sharply. The superior aspect of the fascial incision was grasped with the Coker clamps elevated up and the underlying rectus muscles were dissected off sharply.  The peritoneum was entered bluntly. The peritoneal incision was extended superiorly and inferiorly with good visualization of the bladder. The bladder blade was inserted and palpation was done to assess the fetal position and the location of the uterine vessels. The lower segment of the uterus was incised sharply with the scalpel and extended  bluntly in the cephalo-caudal fashion. The infant was grasped, brought to the incision,  Rotated but delivery did not occur spontaneously. A vacuum was placed on the occiput and with gently traction, redirected the head and brought it up through the incision. Vacuum was removed, time <30sec and remainder of the infant was delivered with fundal pressure.  The cord was clamped and cut after 1 minute delay. The infant was handed off to the waiting pediatrician. The placenta was massaged out then extracted manually. Some tearing of the placenta was noted but no placenta was clearly stuck/ left on the posterior wall. Bleeding was appropriate.  The posterior lower uterine wall looked irregular and 2 figure of 8 sutures  were placed into the posterior uterine wall.  The uterus was exteriorized. The uterus was cleared of all clots and debris. The uterine incision was repaired with 0 Vicryl in a running locked fashion.  A second layer of the same suture was used in an imbricating fashion to obtain excellent hemostasis. An additional figure of 8 was used in the midline and some  cautery was used on the edges.  The uterus was then returned to the abdomen, the gutters were cleared of all clots and debris. The uterine incision was reinspected and found to be hemostatic. The peritoneum was grasped and closed with 2-0 Vicryl in a running fashion. The cut muscle edges and the underside of the fascia were inspected and found to be hemostatic. The fascia was closed with 0 Vicryl in 2 layers. The subcutaneous tissue was irrigated. Scarpa's layer was closed with a 2-0 plain gut suture. The skin was closed with a 4-0 Monocryl in a single layer. The patient tolerated the procedure well. Sponge lap and needle counts were correct x3 and patient was taken to the recovery room in a stable condition.  Aliyanah Rozas A. 12/03/2016 5:55 PM

## 2016-12-03 NOTE — Anesthesia Postprocedure Evaluation (Signed)
Anesthesia Post Note  Patient: Engineer, maintenance (IT)Melanie Munoz  Procedure(s) Performed: Procedure(s) (LRB): CESAREAN SECTION (N/A)     Patient location during evaluation: PACU Anesthesia Type: Spinal Level of consciousness: oriented and awake and alert Pain management: pain level controlled Vital Signs Assessment: post-procedure vital signs reviewed and stable Respiratory status: spontaneous breathing and respiratory function stable Cardiovascular status: blood pressure returned to baseline and stable Postop Assessment: no headache and no backache Anesthetic complications: no    Last Vitals:  Vitals:   12/03/16 1715 12/03/16 1730  BP: 104/75   Pulse: 80 86  Resp: 17 18  Temp:      Last Pain:  Vitals:   12/03/16 1630  TempSrc: Oral  PainSc:    Pain Goal: Patients Stated Pain Goal: 3 (11/28/16 2021)               Melanie Munoz

## 2016-12-04 ENCOUNTER — Encounter (HOSPITAL_COMMUNITY): Payer: Self-pay | Admitting: Obstetrics

## 2016-12-04 LAB — CBC
HEMATOCRIT: 26.5 % — AB (ref 36.0–46.0)
HEMOGLOBIN: 9.2 g/dL — AB (ref 12.0–15.0)
MCH: 32.9 pg (ref 26.0–34.0)
MCHC: 34.7 g/dL (ref 30.0–36.0)
MCV: 94.6 fL (ref 78.0–100.0)
Platelets: 147 10*3/uL — ABNORMAL LOW (ref 150–400)
RBC: 2.8 MIL/uL — AB (ref 3.87–5.11)
RDW: 12.9 % (ref 11.5–15.5)
WBC: 12.8 10*3/uL — AB (ref 4.0–10.5)

## 2016-12-04 MED ORDER — POLYSACCHARIDE IRON COMPLEX 150 MG PO CAPS
150.0000 mg | ORAL_CAPSULE | Freq: Every day | ORAL | Status: DC
  Administered 2016-12-04 – 2016-12-05 (×2): 150 mg via ORAL
  Filled 2016-12-04 (×4): qty 1

## 2016-12-04 MED ORDER — MAGNESIUM OXIDE 400 (241.3 MG) MG PO TABS
400.0000 mg | ORAL_TABLET | Freq: Every day | ORAL | Status: DC
  Administered 2016-12-04 – 2016-12-05 (×2): 400 mg via ORAL
  Filled 2016-12-04 (×4): qty 1

## 2016-12-04 NOTE — Lactation Note (Addendum)
This note was copied from a baby's chart. Lactation Consultation Note  Patient Name: Melanie Munoz Today's Date: 12/04/2016   P1, Baby 35 weeks 1 d.  6121 hours old. Baby cueing.  Assisted w/ latching baby in football hold.  It took a few minutes to achieve a deep latch but baby did well once latched. Sucks and swallows observed. Encouraged mother to hand express often, before and after pumping. Recommend pumping after breastfeeding and give baby back volume pumped. Provided mother with spoon and syringe. Spoon fed baby drops after breastfeeding. Discussed milk storage and cleaning. Reviewed LPI feeding plan.  Reminded about q 3 hours. Mom encouraged to feed baby 8-12 times/24 hours and with feeding cues at least q 3 hours. Mom made aware of O/P services, breastfeeding support groups, community resources, and our phone # for post-discharge questions.          Maternal Data    Feeding Feeding Type: Breast Fed Length of feed: 20 min  LATCH Score/Interventions                      Lactation Tools Discussed/Used     Consult Status      Hardie PulleyBerkelhammer, Ruth Boschen 12/04/2016, 12:28 PM

## 2016-12-04 NOTE — Anesthesia Postprocedure Evaluation (Signed)
Anesthesia Post Note  Patient: Engineer, maintenance (IT)Melanie Munoz  Procedure(s) Performed: Procedure(s) (LRB): CESAREAN SECTION (N/A)     Patient location during evaluation: Mother Baby Anesthesia Type: Spinal Level of consciousness: awake and alert and oriented Pain management: satisfactory to patient Vital Signs Assessment: post-procedure vital signs reviewed and stable Respiratory status: spontaneous breathing and nonlabored ventilation Cardiovascular status: stable Postop Assessment: no headache, no backache, patient able to bend at knees, no signs of nausea or vomiting and adequate PO intake Anesthetic complications: no    Last Vitals:  Vitals:   12/04/16 0600 12/04/16 0800  BP: (!) 94/56 (!) 94/56  Pulse: 98 68  Resp: 18 18  Temp: 36.9 C 36.9 C    Last Pain:  Vitals:   12/04/16 0800  TempSrc: Oral  PainSc: 0-No pain   Pain Goal: Patients Stated Pain Goal: 3 (12/04/16 0800)               Madison HickmanGREGORY,Doretta Remmert

## 2016-12-04 NOTE — Progress Notes (Signed)
POSTOPERATIVE DAY # 1 S/P Primary LTCS, vacuum assisted for bleeding placenta previa, baby boy   S:         Reports feeling good, with minimal soreness             Tolerating po intake / no nausea / no vomiting / + flatus / no BM             Bleeding is moderate             Pain controlled with Motrin and Tylenol             Up ad lib - ambulating well with no dizziness today/ ambulatory/ voiding QS  Newborn breast feeding - latching well and working with lactation  / Circumcision - not planning   O:  VS: BP (!) 94/56 (BP Location: Left Arm)   Pulse 68   Temp 98.5 F (36.9 C) (Oral)   Resp 18   Ht 5\' 6"  (1.676 m)   Wt 83 kg (183 lb)   SpO2 99%   Breastfeeding? Unknown   BMI 29.54 kg/m    LABS:               Recent Labs  12/03/16 1631 12/04/16 0511  WBC  --  12.8*  HGB 10.1* 9.2*  PLT 139* 147*               Bloodtype: --/--/A POS (06/12 0739)  Rubella:                                               I&O: Intake/Output      06/12 0701 - 06/13 0700 06/13 0701 - 06/14 0700   P.O. 960    I.V. (mL/kg) 3400 (41)    Total Intake(mL/kg) 4360 (52.5)    Urine (mL/kg/hr) 2275 (1.1)    Blood 1400 (0.7)    Total Output 3675     Net +685                       Physical Exam:             Alert and Oriented X3  Lungs: Clear and unlabored  Heart: regular rate and rhythm / no mumurs  Abdomen: soft, non-tender, non-distended, active bowel sounds in all 4 quadrants             Fundus: firm, non-tender, U-1             Dressing: honeycomb dsg and steri-strips with 50% shadowing noted, otherwise intact              Incision:  approximated with sutures / no erythema / no ecchymosis / old drainage noted  Perineum: intact  Lochia: appropriate  Extremities: +1 BLE edema, no calf pain or tenderness  A:        POD # 1 S/P Primary LTCS , vacuum assisted for bleeding placenta previa            ABL Anemia compounding IDA - stable, symptomatic today   P:        Routine postoperative care               Niferex 150mg  daily  Mag ox 400mg  daily  See lactation today  Ambulate in halls  D/C IV today  May shower today  Continue current care  Carlean JewsMeredith Farid Grigorian, MSN, CNM [facility]

## 2016-12-04 NOTE — Addendum Note (Signed)
Addendum  created 12/04/16 0851 by Shanon PayorGregory, Kalib Bhagat M, CRNA   Sign clinical note

## 2016-12-05 LAB — BIRTH TISSUE RECOVERY COLLECTION (PLACENTA DONATION)

## 2016-12-05 NOTE — Progress Notes (Addendum)
POSTOPERATIVE DAY # 1 S/P Primary LTCS, vacuum assisted for bleeding placenta previa, baby boy  Subjective:   circ declined  Reports feeling sore but well. Feeding: breast Patient reports tolerating PO.  Breast symptoms: pumping after feeds, + colostrum Pain controlled withacetaminophen and ibuprofen (OTC) Denies HA/SOB/C/P/N/V/dizziness. Flatus present. She reports vaginal bleeding as normal, without clots.  She is ambulating, urinating without difficulty.     Objective:   VS:    Vitals:   12/04/16 0800 12/04/16 1200 12/04/16 2020 12/05/16 0827  BP: (!) 94/56 (!) 90/56 113/62 101/62  Pulse: 68 72 84 87  Resp: 18 18 16 16   Temp: 98.5 F (36.9 C) 98.1 F (36.7 C) 98.8 F (37.1 C) 98.3 F (36.8 C)  TempSrc: Oral Oral Oral Oral  SpO2: 99% 97% 99% 100%  Weight:      Height:        No intake or output data in the 24 hours ending 12/05/16 1147      Recent Labs  12/03/16 1631 12/04/16 0511  WBC  --  12.8*  HGB 10.1* 9.2*  HCT 29.4* 26.5*  PLT 139* 147*     Blood type: --/--/A POS (06/12 0739)  Rubella:   non-immune    Physical Exam:  General: alert, cooperative and no distress Abdomen: soft, nontender, normal bowel sounds Incision: intact and serous drainage present Uterine Fundus: firm, below umbilicus, nontender Lochia: minimal Ext: no edema, redness or tenderness in the calves or thighs      Assessment/Plan: 31 y.o.   POD# 2. [id number]                  Principal Problem:   Postpartum care following cesarean delivery (6/12) Active Problems:   Placenta previa antepartum in third trimester   S/P cesarean section (6/12) Indication: Bleeding placenta previa  Maternal anemia - asymptomatic - on oral Fe and Mag ox, continue  Doing well, stable.     Change dressing today      Advance diet as tolerated Encourage rest when baby rests Breastfeeding support Encourage to ambulate Routine post-op care  Neta Mendsaniela C Cady Hafen, CNM, MSN 12/05/2016, 11:47 AM

## 2016-12-05 NOTE — Lactation Note (Signed)
This note was copied from a baby's chart. Lactation Consultation Note  Patient Name: Melanie Munoz: 12/05/2016 Reason for consult: Follow-up assessment;Late preterm infant Parents called for assist with finger feeding.  Baby took 10 mls of transitional milk with syringe/finger feed and tolerated well.  Maternal Data    Feeding Feeding Type: Breast Milk Length of feed: 10 min  LATCH Score/Interventions Latch: Grasps breast easily, tongue down, lips flanged, rhythmical sucking. Intervention(s): Adjust position;Assist with latch;Breast massage;Breast compression  Audible Swallowing: Spontaneous and intermittent Intervention(s): Skin to skin;Hand expression;Alternate breast massage  Type of Nipple: Everted at rest and after stimulation  Comfort (Breast/Nipple): Soft / non-tender     Hold (Positioning): No assistance needed to correctly position infant at breast. Intervention(s): Breastfeeding basics reviewed;Support Pillows;Skin to skin  LATCH Score: 10  Lactation Tools Discussed/Used     Consult Status Consult Status: Follow-up Munoz: 12/06/16 Follow-up type: In-patient    Huston FoleyMOULDEN, Mathayus Stanbery S 12/05/2016, 6:06 PM

## 2016-12-05 NOTE — Lactation Note (Signed)
This note was copied from a baby's chart. Lactation Consultation Note  Patient Name: Melanie Munoz Antony ZOXWR'UToday's Date: 12/05/2016 Reason for consult: Follow-up assessment;Late preterm infant Assisted mom with waking techniques.  Baby took 3 mls of transitional milk by finger feed and did well.  Positioned baby at breast and after a few attempts baby latched well and sucked with frequents swallows.  Mom's breasts are filling and breast baby feeds on is lighter at the end of feeding.  Output WNL.  We discussed waking baby at 2 1/2 hours if sleeping.  Instructed to continue post pumping and giving all pumped milk back to baby.  Parents know baby has lost 7%.  Instructed to ask for formula for supplement if not post pumping at least 10 mls.  Recommended finger feeding and encouraged to call for assist prn.  Report given to nurse.  Maternal Data    Feeding Feeding Type: Breast Fed Length of feed: 25 min  LATCH Score/Interventions Latch: Grasps breast easily, tongue down, lips flanged, rhythmical sucking. Intervention(s): Adjust position;Assist with latch;Breast massage;Breast compression  Audible Swallowing: Spontaneous and intermittent Intervention(s): Skin to skin;Hand expression;Alternate breast massage  Type of Nipple: Everted at rest and after stimulation  Comfort (Breast/Nipple): Soft / non-tender     Hold (Positioning): No assistance needed to correctly position infant at breast. Intervention(s): Breastfeeding basics reviewed;Support Pillows;Skin to skin  LATCH Score: 10  Lactation Tools Discussed/Used     Consult Status Consult Status: Follow-up Date: 12/06/16 Follow-up type: In-patient    Huston FoleyMOULDEN, Jasmina Gendron S 12/05/2016, 4:39 PM

## 2016-12-06 MED ORDER — POLYSACCHARIDE IRON COMPLEX 150 MG PO CAPS: 150.0000 mg | ORAL_CAPSULE | Freq: Every day | ORAL | 1 refills | Status: DC

## 2016-12-06 MED ORDER — ACETAMINOPHEN 325 MG PO TABS: 650.0000 mg | ORAL_TABLET | ORAL | 0 refills | Status: DC | PRN

## 2016-12-06 MED ORDER — IBUPROFEN 600 MG PO TABS: 600.0000 mg | ORAL_TABLET | Freq: Four times a day (QID) | ORAL | 0 refills | Status: DC

## 2016-12-06 MED ORDER — SENNOSIDES-DOCUSATE SODIUM 8.6-50 MG PO TABS: 2.0000 | ORAL_TABLET | ORAL | 0 refills | Status: DC

## 2016-12-06 NOTE — Progress Notes (Signed)
POSTOPERATIVE DAY # 3 S/P Urgent Primary LTCS, bleeding placenta previa, baby boy  Subjective: No complaints today.  circ declined  Objective: BP 117/71 (BP Location: Left Arm)   Pulse 81   Temp 98.4 F (36.9 C) (Oral)   Resp 18   Ht 5\' 6"  (1.676 m)   Wt 183 lb (83 kg)   SpO2 100%   Breastfeeding? Unknown   BMI 29.54 kg/m     Recent Labs  12/03/16 1631 12/04/16 0511  WBC  --  12.8*  HGB 10.1* 9.2*  HCT 29.4* 26.5*  PLT 139* 147*     Blood type: --/--/A POS (06/12 0739)  Rubella:   non-immune    Physical Exam:  General: alert, cooperative and no distress Abdomen: soft, nontender, normal bowel sounds Incision: intact and serous drainage present Uterine Fundus: firm, below umbilicus, nontender Lochia: minimal Ext: no edema, redness or tenderness in the calves or thighs    Assessment/Plan: 31 y.o.   POD# 3. [id number]                  Principal Problem:   Postpartum care following cesarean delivery (6/12) Active Problems:   Placenta previa antepartum in third trimester   S/P cesarean section (6/12) Indication: Bleeding placenta previa  Maternal anemia - asymptomatic - on oral Fe and Mag ox, continue  Doing well, stable.     Discharge home F/up with Dr Ernestina PennaFogleman in 6 weeks Routine post-op care and PP care reviewed  Jaymie Misch R

## 2016-12-06 NOTE — Discharge Summary (Signed)
Obstetric Discharge Summary Reason for Admission: Placenta previa with bleeding, 3rd episode at 33 wks.  Prenatal Procedures: NST, ultrasound and BTMZ for fetal lung maturity at prior admission  Intrapartum Procedures: cesarean: low cervical, transverse- emergency LTCS for bleeding at 35.1 wks  Postpartum Procedures: none Complications-Operative and Postpartum: Anemia from acute blood loss on top of chronic anemia Hemoglobin  Date Value Ref Range Status  12/04/2016 9.2 (L) 12.0 - 15.0 g/dL Final   HCT  Date Value Ref Range Status  12/04/2016 26.5 (L) 36.0 - 46.0 % Final    Physical Exam:  General: alert and cooperative Lochia: appropriate Uterine Fundus: firm Incision: no significant drainage DVT Evaluation: No evidence of DVT seen on physical exam.  Discharge Diagnoses: Preterm delivery at 35 wks for hemorrhage from placenta previa   Discharge Information: Date: 12/06/2016 Activity: pelvic rest Diet: routine Medications: PNV, Ibuprofen, Iron and Tylenol Condition: stable Instructions: refer to practice specific booklet Discharge to: home Follow-up Information    Melanie Munoz, Kelly, MD Follow up in 6 week(s).   Specialty:  Obstetrics and Gynecology Contact information: [address] [place] [id number] [phone]           Newborn Data: Live born female  Birth Weight: 6 lb 6.7 oz (2910 g) APGAR: 8, 9  Home with mother.  Melanie Munoz 12/06/2016, 1:27 PM

## 2016-12-16 ENCOUNTER — Inpatient Hospital Stay (HOSPITAL_COMMUNITY): Admission: RE | Admit: 2016-12-16 | Payer: BC Managed Care – PPO | Source: Ambulatory Visit | Admitting: Obstetrics

## 2016-12-16 ENCOUNTER — Encounter (HOSPITAL_COMMUNITY): Admission: RE | Payer: Self-pay | Source: Ambulatory Visit

## 2016-12-16 SURGERY — Surgical Case
Anesthesia: Spinal

## 2019-01-20 ENCOUNTER — Encounter (HOSPITAL_COMMUNITY): Payer: Self-pay | Admitting: Emergency Medicine

## 2019-01-20 ENCOUNTER — Other Ambulatory Visit: Payer: Self-pay

## 2019-01-20 ENCOUNTER — Emergency Department (HOSPITAL_COMMUNITY): Payer: BC Managed Care – PPO

## 2019-01-20 DIAGNOSIS — Z20828 Contact with and (suspected) exposure to other viral communicable diseases: Secondary | ICD-10-CM | POA: Diagnosis not present

## 2019-01-20 DIAGNOSIS — J189 Pneumonia, unspecified organism: Secondary | ICD-10-CM | POA: Insufficient documentation

## 2019-01-20 DIAGNOSIS — R112 Nausea with vomiting, unspecified: Secondary | ICD-10-CM | POA: Diagnosis not present

## 2019-01-20 DIAGNOSIS — R05 Cough: Secondary | ICD-10-CM | POA: Diagnosis present

## 2019-01-20 LAB — CBC WITH DIFFERENTIAL/PLATELET
Abs Immature Granulocytes: 0.08 10*3/uL — ABNORMAL HIGH (ref 0.00–0.07)
Basophils Absolute: 0 10*3/uL (ref 0.0–0.1)
Basophils Relative: 0 %
Eosinophils Absolute: 0 10*3/uL (ref 0.0–0.5)
Eosinophils Relative: 0 %
HCT: 43 % (ref 36.0–46.0)
Hemoglobin: 14.3 g/dL (ref 12.0–15.0)
Immature Granulocytes: 1 %
Lymphocytes Relative: 12 %
Lymphs Abs: 1.6 10*3/uL (ref 0.7–4.0)
MCH: 31.6 pg (ref 26.0–34.0)
MCHC: 33.3 g/dL (ref 30.0–36.0)
MCV: 95.1 fL (ref 80.0–100.0)
Monocytes Absolute: 1.1 10*3/uL — ABNORMAL HIGH (ref 0.1–1.0)
Monocytes Relative: 8 %
Neutro Abs: 10.8 10*3/uL — ABNORMAL HIGH (ref 1.7–7.7)
Neutrophils Relative %: 79 %
Platelets: 236 10*3/uL (ref 150–400)
RBC: 4.52 MIL/uL (ref 3.87–5.11)
RDW: 11.9 % (ref 11.5–15.5)
WBC: 13.6 10*3/uL — ABNORMAL HIGH (ref 4.0–10.5)
nRBC: 0 % (ref 0.0–0.2)

## 2019-01-20 LAB — I-STAT BETA HCG BLOOD, ED (NOT ORDERABLE): I-stat hCG, quantitative: <5 m[IU]/mL (ref <5)

## 2019-01-20 LAB — COMPREHENSIVE METABOLIC PANEL
ALT: 15 U/L (ref 0–44)
AST: 22 U/L (ref 15–41)
Albumin: 4.2 g/dL (ref 3.5–5.0)
Alkaline Phosphatase: 51 U/L (ref 38–126)
Anion gap: 11 (ref 5–15)
BUN: 7 mg/dL (ref 6–20)
CO2: 25 mmol/L (ref 22–32)
Calcium: 8.6 mg/dL — ABNORMAL LOW (ref 8.9–10.3)
Chloride: 102 mmol/L (ref 98–111)
Creatinine, Ser: 0.79 mg/dL (ref 0.44–1.00)
GFR calc Af Amer: >60 mL/min (ref >60)
GFR calc non Af Amer: >60 mL/min (ref >60)
Glucose, Bld: 132 mg/dL — ABNORMAL HIGH (ref 70–99)
Potassium: 3.5 mmol/L (ref 3.5–5.1)
Sodium: 138 mmol/L (ref 135–145)
Total Bilirubin: 0.8 mg/dL (ref 0.3–1.2)
Total Protein: 8 g/dL (ref 6.5–8.1)

## 2019-01-20 NOTE — ED Triage Notes (Signed)
Patient here from home with complaints of cough x2 weeks. Now complains of nausea, vomiting. Reports that she is awaiting COVID results.

## 2019-01-21 ENCOUNTER — Emergency Department (HOSPITAL_COMMUNITY)
Admission: EM | Admit: 2019-01-21 | Discharge: 2019-01-21 | Disposition: A | Discharge status: Home / Self Care | Payer: BC Managed Care – PPO | Attending: Emergency Medicine | Admitting: Emergency Medicine

## 2019-01-21 ENCOUNTER — Other Ambulatory Visit: Payer: Self-pay

## 2019-01-21 DIAGNOSIS — J189 Pneumonia, unspecified organism: Secondary | ICD-10-CM

## 2019-01-21 LAB — URINALYSIS, ROUTINE W REFLEX MICROSCOPIC
Bilirubin Urine: NEGATIVE
Glucose, UA: NEGATIVE mg/dL
Hgb urine dipstick: NEGATIVE
Ketones, ur: 80 mg/dL — AB
Leukocytes,Ua: NEGATIVE
Nitrite: NEGATIVE
Protein, ur: 100 mg/dL — AB
Specific Gravity, Urine: 1.018 (ref 1.005–1.030)
pH: 6 (ref 5.0–8.0)

## 2019-01-21 LAB — SARS CORONAVIRUS 2 BY RT PCR (HOSPITAL ORDER, PERFORMED IN ~~LOC~~ HOSPITAL LAB): SARS Coronavirus 2: NEGATIVE

## 2019-01-21 MED ORDER — KETOROLAC TROMETHAMINE 30 MG/ML IJ SOLN
30.0000 mg | Freq: Once | INTRAMUSCULAR | Status: AC
  Administered 2019-01-21: 06:00:00 30 mg via INTRAVENOUS
  Filled 2019-01-21: qty 1

## 2019-01-21 MED ORDER — ACETAMINOPHEN 325 MG PO TABS: 650.0000 mg | ORAL_TABLET | Freq: Once | ORAL | Status: DC

## 2019-01-21 MED ORDER — SODIUM CHLORIDE 0.9 % IV SOLN
500.0000 mg | Freq: Once | INTRAVENOUS | Status: AC
  Administered 2019-01-21: 06:00:00 500 mg via INTRAVENOUS
  Filled 2019-01-21: qty 500

## 2019-01-21 MED ORDER — AMOXICILLIN-POT CLAVULANATE 875-125 MG PO TABS: 1.0000 | ORAL_TABLET | Freq: Two times a day (BID) | ORAL | 0 refills | Status: DC

## 2019-01-21 MED ORDER — ONDANSETRON HCL 4 MG/2ML IJ SOLN
4.0000 mg | Freq: Once | INTRAMUSCULAR | Status: AC
  Administered 2019-01-21: 06:00:00 4 mg via INTRAVENOUS
  Filled 2019-01-21: qty 2

## 2019-01-21 MED ORDER — SODIUM CHLORIDE 0.9 % IV SOLN
1.0000 g | Freq: Once | INTRAVENOUS | Status: AC
  Administered 2019-01-21: 1 g via INTRAVENOUS
  Filled 2019-01-21: qty 10

## 2019-01-21 MED ORDER — SODIUM CHLORIDE 0.9 % IV BOLUS (SEPSIS)
1000.0000 mL | Freq: Once | INTRAVENOUS | Status: AC
  Administered 2019-01-21: 06:00:00 1000 mL via INTRAVENOUS

## 2019-01-21 MED ORDER — AZITHROMYCIN 250 MG PO TABS: ORAL_TABLET | ORAL | 0 refills | Status: DC

## 2019-01-21 NOTE — ED Provider Notes (Signed)
[facility]-EMERGENCY DEPT Provider Note   CSN: [id number] Arrival date & time: 01/20/19  2127     History   Chief Complaint Chief Complaint  Patient presents with  . Cough  . Emesis  . Nausea    HPI Melanie Munoz is a 33 y.o. female.     The history is provided by the patient.  Cough Cough characteristics:  Productive Severity:  Moderate Onset quality:  Gradual Timing:  Intermittent Progression:  Worsening Chronicity:  New Smoker: no   Relieved by:  Nothing Worsened by:  Nothing Associated symptoms: chest pain, chills, shortness of breath and sore throat   Associated symptoms: no fever   Emesis Associated symptoms: chills, cough and sore throat   Associated symptoms: no fever   Patient presents with cough.  Patient reports that early in the month, she began having a cough.  This was around the same time her son had a cough.  Over the past several days her cough has worsened.  She is now reporting shortness of breath.  She reports dyspnea on exertion.  She reports sore throat and chills.  She reports chest pain with cough and breathing   PMH-none Soc hx - nonsmoker Does not use OCPs Patient Active Problem List   Diagnosis Date Noted  . Postpartum care following cesarean delivery (6/12) 12/03/2016  . S/P cesarean section (6/12) Indication: Bleeding placenta previa  12/03/2016  . Placenta previa antepartum in third trimester 11/18/2016  . Placenta previa antepartum, third trimester 11/09/2016    Past Surgical History:  Procedure Laterality Date  . APPENDECTOMY    . CESAREAN SECTION N/A 12/03/2016   Procedure: CESAREAN SECTION;  Surgeon: [name], Kelly, MD;  [facility];  Service: Obstetrics;  Laterality: N/A;     OB History    Gravida  1   Para  1   Term      Preterm  1   AB      Living  1     SAB      TAB      Ectopic      Multiple  0   Live Births  1            Home Medications    Prior to  Admission medications   Medication Sig Start Date End Date Taking? Authorizing Provider  acetaminophen (TYLENOL) 325 MG tablet Take 2 tablets (650 mg total) by mouth every 4 (four) hours as needed (for pain scale < 4). 12/06/16   Shea EvansMody, Vaishali, MD  ibuprofen (ADVIL,MOTRIN) 600 MG tablet Take 1 tablet (600 mg total) by mouth every 6 (six) hours. 12/06/16   Shea EvansMody, Vaishali, MD  iron polysaccharides (NIFEREX) 150 MG capsule Take 1 capsule (150 mg total) by mouth daily. 12/06/16   Shea EvansMody, Vaishali, MD  Prenatal Vit-Fe Fumarate-FA (PRENATAL MULTIVITAMIN) TABS tablet Take 1 tablet by mouth at bedtime.     [provider]  senna-docusate (SENOKOT-S) 8.6-50 MG tablet Take 2 tablets by mouth daily. 12/07/16   Shea EvansMody, Vaishali, MD    Family History Family History  Problem Relation Age of Onset  . Diabetes Mother   . Cancer Father   . Cancer Maternal Grandmother   . Diabetes Maternal Grandfather     Social History Social History   Tobacco Use  . Smoking status: Never Smoker  . Smokeless tobacco: Never Used  Substance Use Topics  . Alcohol use: No    Alcohol/week: 0.0 standard drinks  . Drug use: No  Allergies   Patient has no known allergies.   Review of Systems Review of Systems  Constitutional: Positive for chills. Negative for fever.  HENT: Positive for sore throat.   Respiratory: Positive for cough and shortness of breath.   Cardiovascular: Positive for chest pain.  Gastrointestinal: Positive for vomiting.  All other systems reviewed and are negative.    Physical Exam Updated Vital Signs BP (!) 112/94 (BP Location: Right Arm)   Pulse (!) 110   Temp 99.3 F (37.4 C) (Oral)   Resp 18   LMP 01/20/2019   SpO2 95%   Physical Exam CONSTITUTIONAL: Well developed/well nourished HEAD: Normocephalic/atraumatic EYES: EOMI/PERRL ENMT: Mucous membranes moist NECK: supple no meningeal signs SPINE/BACK:entire spine nontender CV: Tachycardic LUNGS: Mild tachypnea noted  Chest-no tenderness ABDOMEN: soft, nontender, no rebound or guarding, bowel sounds noted throughout abdomen GU:no cva tenderness NEURO: Pt is awake/alert/appropriate, moves all extremitiesx4.  No facial droop.   EXTREMITIES: pulses normal/equal, full ROM, no lower extremity edema SKIN: warm, color normal PSYCH: no abnormalities of mood noted, alert and oriented to situation   ED Treatments / Results  Labs (all labs ordered are listed, but only abnormal results are displayed) Labs Reviewed  COMPREHENSIVE METABOLIC PANEL - Abnormal; Notable for the following components:      Result Value   Glucose, Bld 132 (*)    Calcium 8.6 (*)    All other components within normal limits  CBC WITH DIFFERENTIAL/PLATELET - Abnormal; Notable for the following components:   WBC 13.6 (*)    Neutro Abs 10.8 (*)    Monocytes Absolute 1.1 (*)    Abs Immature Granulocytes 0.08 (*)    All other components within normal limits  URINALYSIS, ROUTINE W REFLEX MICROSCOPIC - Abnormal; Notable for the following components:   APPearance HAZY (*)    Ketones, ur 80 (*)    Protein, ur 100 (*)    Bacteria, UA RARE (*)    All other components within normal limits  SARS CORONAVIRUS 2 (HOSPITAL ORDER, [facility])  I-STAT BETA HCG BLOOD, ED (NOT ORDERABLE)    EKG None  Radiology Dg Chest 2 View  Result Date: 01/20/2019 CLINICAL DATA:  Cough x2 weeks EXAM: CHEST - 2 VIEW COMPARISON:  None. FINDINGS: Right upper lobe and right lower lobe opacity, suspicious for multifocal pneumonia. Left lung is clear. No pleural effusion or pneumothorax. The heart is normal in size. Visualized osseous structures are within normal limits. IMPRESSION: Right upper and lower lobe pneumonia. Electronically Signed   By: Julian Hy M.D.   On: 01/20/2019 23:13    Procedures Procedures Medications Ordered in ED Medications  sodium chloride 0.9 % bolus 1,000 mL (0 mLs Intravenous Stopped 01/21/19 0650)   ketorolac (TORADOL) 30 MG/ML injection 30 mg (30 mg Intravenous Given 01/21/19 0530)  cefTRIAXone (ROCEPHIN) 1 g in sodium chloride 0.9 % 100 mL IVPB (0 g Intravenous Stopped 01/21/19 0624)  azithromycin (ZITHROMAX) 500 mg in sodium chloride 0.9 % 250 mL IVPB (0 mg Intravenous Stopped 01/21/19 0650)  ondansetron (ZOFRAN) injection 4 mg (4 mg Intravenous Given 01/21/19 0530)     Initial Impression / Assessment and Plan / ED Course  I have reviewed the triage vital signs and the nursing notes.  Pertinent labs & imaging results that were available during my care of the patient were reviewed by me and considered in my medical decision making (see chart for details).        4:27 AM Patient reports  having a cough for several weeks, but is acutely worsened over the past several days.  She is now having vomiting.  She reports dyspnea on exertion. X-rays consistent with pneumonia She reports outpatient COVID-19 testing that has not resulted yet.  She will be tested at this time. 6:56 AM Patient is COVID negative. She is dramatically improved.  She walked in the ER without any difficulty.  No hypoxia.  Blood pressure over 100.  Heart rate is improved. She is not septic appearing.  She will be appropriate for outpatient management of pneumonia. Advised need for repeat x-ray in a month. We discussed strict return precautions  Melanie Munoz was evaluated in Emergency Department on 01/21/2019 for the symptoms described in the history of present illness. She was evaluated in the context of the global COVID-19 pandemic, which necessitated consideration that the patient might be at risk for infection with the SARS-CoV-2 virus that causes COVID-19. Institutional protocols and algorithms that pertain to the evaluation of patients at risk for COVID-19 are in a state of rapid change based on information released by regulatory bodies including the CDC and federal and state organizations. These policies and algorithms  were followed during the patient's care in the ED.   Final Clinical Impressions(s) / ED Diagnoses   Final diagnoses:  Community acquired pneumonia of right upper lobe of lung [facility](HCC)    ED Discharge Orders         Ordered    azithromycin (ZITHROMAX) 250 MG tablet     01/21/19 0645    amoxicillin-clavulanate (AUGMENTIN) 875-125 MG tablet  2 times daily     01/21/19 0645           Zadie RhineWickline, Niyonna Betsill, MD 01/21/19 [phone]

## 2019-01-21 NOTE — ED Notes (Signed)
Culture bottle #1

## 2019-01-21 NOTE — ED Notes (Signed)
Cultures bottle # 2

## 2019-01-21 NOTE — ED Notes (Signed)
Pt. Ambulated down the hall and back to her room on 96% room air without difficulty. Pt. Gait steady on her feet.

## 2019-07-09 LAB — OB RESULTS CONSOLE RUBELLA ANTIBODY, IGM: Rubella: IMMUNE

## 2019-07-09 LAB — OB RESULTS CONSOLE HEPATITIS B SURFACE ANTIGEN: Hepatitis B Surface Ag: NEGATIVE

## 2019-07-09 LAB — OB RESULTS CONSOLE RPR: RPR: NONREACTIVE

## 2019-07-09 LAB — OB RESULTS CONSOLE HIV ANTIBODY (ROUTINE TESTING): HIV: NONREACTIVE

## 2019-12-20 LAB — OB RESULTS CONSOLE GBS: GBS: NEGATIVE

## 2020-01-09 ENCOUNTER — Encounter (HOSPITAL_COMMUNITY): Payer: Self-pay | Admitting: *Deleted

## 2020-01-09 ENCOUNTER — Inpatient Hospital Stay (HOSPITAL_COMMUNITY)
Admission: AD | Admit: 2020-01-09 | Discharge: 2020-01-12 | DRG: 805 | Disposition: A | Discharge status: Home / Self Care | Payer: BC Managed Care – PPO | Attending: Obstetrics and Gynecology | Admitting: Obstetrics and Gynecology

## 2020-01-09 DIAGNOSIS — O34211 Maternal care for low transverse scar from previous cesarean delivery: Principal | ICD-10-CM | POA: Diagnosis present

## 2020-01-09 DIAGNOSIS — D6959 Other secondary thrombocytopenia: Secondary | ICD-10-CM | POA: Diagnosis present

## 2020-01-09 DIAGNOSIS — Z20822 Contact with and (suspected) exposure to covid-19: Secondary | ICD-10-CM | POA: Diagnosis present

## 2020-01-09 DIAGNOSIS — O34219 Maternal care for unspecified type scar from previous cesarean delivery: Secondary | ICD-10-CM | POA: Diagnosis not present

## 2020-01-09 DIAGNOSIS — D62 Acute posthemorrhagic anemia: Secondary | ICD-10-CM | POA: Diagnosis not present

## 2020-01-09 DIAGNOSIS — O411290 Chorioamnionitis, unspecified trimester, not applicable or unspecified: Secondary | ICD-10-CM | POA: Diagnosis not present

## 2020-01-09 DIAGNOSIS — O9081 Anemia of the puerperium: Secondary | ICD-10-CM | POA: Diagnosis not present

## 2020-01-09 DIAGNOSIS — O411230 Chorioamnionitis, third trimester, not applicable or unspecified: Secondary | ICD-10-CM | POA: Diagnosis present

## 2020-01-09 DIAGNOSIS — O9912 Other diseases of the blood and blood-forming organs and certain disorders involving the immune mechanism complicating childbirth: Secondary | ICD-10-CM | POA: Diagnosis present

## 2020-01-09 DIAGNOSIS — Z3A39 39 weeks gestation of pregnancy: Secondary | ICD-10-CM

## 2020-01-09 HISTORY — DX: Other specified health status: Z78.9

## 2020-01-10 ENCOUNTER — Encounter (HOSPITAL_COMMUNITY): Payer: Self-pay | Admitting: Obstetrics and Gynecology

## 2020-01-10 ENCOUNTER — Inpatient Hospital Stay (HOSPITAL_COMMUNITY): Payer: BC Managed Care – PPO | Admitting: Anesthesiology

## 2020-01-10 ENCOUNTER — Other Ambulatory Visit: Payer: Self-pay

## 2020-01-10 DIAGNOSIS — Z3A39 39 weeks gestation of pregnancy: Secondary | ICD-10-CM

## 2020-01-10 DIAGNOSIS — O411290 Chorioamnionitis, unspecified trimester, not applicable or unspecified: Secondary | ICD-10-CM | POA: Diagnosis not present

## 2020-01-10 DIAGNOSIS — O9081 Anemia of the puerperium: Secondary | ICD-10-CM | POA: Diagnosis not present

## 2020-01-10 DIAGNOSIS — Z0371 Encounter for suspected problem with amniotic cavity and membrane ruled out: Secondary | ICD-10-CM | POA: Diagnosis not present

## 2020-01-10 DIAGNOSIS — O34211 Maternal care for low transverse scar from previous cesarean delivery: Secondary | ICD-10-CM | POA: Diagnosis present

## 2020-01-10 DIAGNOSIS — Z20822 Contact with and (suspected) exposure to covid-19: Secondary | ICD-10-CM | POA: Diagnosis present

## 2020-01-10 DIAGNOSIS — O411230 Chorioamnionitis, third trimester, not applicable or unspecified: Secondary | ICD-10-CM | POA: Diagnosis present

## 2020-01-10 DIAGNOSIS — D62 Acute posthemorrhagic anemia: Secondary | ICD-10-CM | POA: Diagnosis not present

## 2020-01-10 DIAGNOSIS — O26893 Other specified pregnancy related conditions, third trimester: Secondary | ICD-10-CM | POA: Diagnosis present

## 2020-01-10 DIAGNOSIS — O9912 Other diseases of the blood and blood-forming organs and certain disorders involving the immune mechanism complicating childbirth: Secondary | ICD-10-CM | POA: Diagnosis present

## 2020-01-10 DIAGNOSIS — O34219 Maternal care for unspecified type scar from previous cesarean delivery: Secondary | ICD-10-CM | POA: Diagnosis not present

## 2020-01-10 DIAGNOSIS — D6959 Other secondary thrombocytopenia: Secondary | ICD-10-CM | POA: Diagnosis present

## 2020-01-10 LAB — CBC
HCT: 40.1 % (ref 36.0–46.0)
Hemoglobin: 13.8 g/dL (ref 12.0–15.0)
MCH: 32.5 pg (ref 26.0–34.0)
MCHC: 34.4 g/dL (ref 30.0–36.0)
MCV: 94.6 fL (ref 80.0–100.0)
Platelets: 148 10*3/uL — ABNORMAL LOW (ref 150–400)
RBC: 4.24 MIL/uL (ref 3.87–5.11)
RDW: 12 % (ref 11.5–15.5)
WBC: 12.3 10*3/uL — ABNORMAL HIGH (ref 4.0–10.5)
nRBC: 0 % (ref 0.0–0.2)

## 2020-01-10 LAB — TYPE AND SCREEN
ABO/RH(D): A POS
Antibody Screen: NEGATIVE

## 2020-01-10 LAB — SARS CORONAVIRUS 2 BY RT PCR (HOSPITAL ORDER, PERFORMED IN ~~LOC~~ HOSPITAL LAB): SARS Coronavirus 2: NEGATIVE

## 2020-01-10 LAB — RPR: RPR Ser Ql: NONREACTIVE

## 2020-01-10 MED ORDER — OXYTOCIN-SODIUM CHLORIDE 30-0.9 UT/500ML-% IV SOLN
20.0000 [IU] | Freq: Once | INTRAVENOUS | Status: DC
  Filled 2020-01-10: qty 500

## 2020-01-10 MED ORDER — IBUPROFEN 600 MG PO TABS
600.0000 mg | ORAL_TABLET | Freq: Four times a day (QID) | ORAL | Status: DC
  Administered 2020-01-10 – 2020-01-12 (×9): 600 mg via ORAL
  Filled 2020-01-10 (×9): qty 1

## 2020-01-10 MED ORDER — ACETAMINOPHEN 500 MG PO TABS
1000.0000 mg | ORAL_TABLET | Freq: Once | ORAL | Status: AC
  Administered 2020-01-10: 1000 mg via ORAL
  Filled 2020-01-10: qty 2

## 2020-01-10 MED ORDER — DIPHENHYDRAMINE HCL 25 MG PO CAPS: 25.0000 mg | ORAL_CAPSULE | Freq: Four times a day (QID) | ORAL | Status: DC | PRN

## 2020-01-10 MED ORDER — ONDANSETRON HCL 4 MG PO TABS: 4.0000 mg | ORAL_TABLET | ORAL | Status: DC | PRN

## 2020-01-10 MED ORDER — SIMETHICONE 80 MG PO CHEW: 80.0000 mg | CHEWABLE_TABLET | ORAL | Status: DC | PRN

## 2020-01-10 MED ORDER — MISOPROSTOL 200 MCG PO TABS
ORAL_TABLET | ORAL | Status: AC
  Administered 2020-01-10: 800 ug via RECTAL
  Filled 2020-01-10: qty 4

## 2020-01-10 MED ORDER — COCONUT OIL OIL: 1.0000 "application " | TOPICAL_OIL | Status: DC | PRN

## 2020-01-10 MED ORDER — LIDOCAINE HCL (PF) 1 % IJ SOLN: 30.0000 mL | INTRAMUSCULAR | Status: DC | PRN

## 2020-01-10 MED ORDER — DIBUCAINE (PERIANAL) 1 % EX OINT: 1.0000 "application " | TOPICAL_OINTMENT | CUTANEOUS | Status: DC | PRN

## 2020-01-10 MED ORDER — CLINDAMYCIN PHOSPHATE 900 MG/50ML IV SOLN
900.0000 mg | Freq: Three times a day (TID) | INTRAVENOUS | Status: DC
  Administered 2020-01-10: 900 mg via INTRAVENOUS
  Filled 2020-01-10 (×3): qty 50

## 2020-01-10 MED ORDER — SODIUM CHLORIDE (PF) 0.9 % IJ SOLN
INTRAMUSCULAR | Status: DC | PRN
  Administered 2020-01-10: 12 mL/h via EPIDURAL

## 2020-01-10 MED ORDER — OXYTOCIN-SODIUM CHLORIDE 30-0.9 UT/500ML-% IV SOLN
2.5000 [IU]/h | INTRAVENOUS | Status: DC
  Filled 2020-01-10: qty 500

## 2020-01-10 MED ORDER — FENTANYL-BUPIVACAINE-NACL 0.5-0.125-0.9 MG/250ML-% EP SOLN
EPIDURAL | Status: AC
  Filled 2020-01-10: qty 250

## 2020-01-10 MED ORDER — MISOPROSTOL 200 MCG PO TABS
800.0000 ug | ORAL_TABLET | Freq: Once | ORAL | Status: AC
  Administered 2020-01-10: 800 ug via RECTAL

## 2020-01-10 MED ORDER — EPHEDRINE 5 MG/ML INJ: 10.0000 mg | INTRAVENOUS | Status: DC | PRN

## 2020-01-10 MED ORDER — DIPHENHYDRAMINE HCL 50 MG/ML IJ SOLN: 12.5000 mg | INTRAMUSCULAR | Status: DC | PRN

## 2020-01-10 MED ORDER — OXYCODONE-ACETAMINOPHEN 5-325 MG PO TABS: 2.0000 | ORAL_TABLET | ORAL | Status: DC | PRN

## 2020-01-10 MED ORDER — PHENYLEPHRINE 40 MCG/ML (10ML) SYRINGE FOR IV PUSH (FOR BLOOD PRESSURE SUPPORT)
80.0000 ug | PREFILLED_SYRINGE | INTRAVENOUS | Status: DC | PRN
  Filled 2020-01-10: qty 10

## 2020-01-10 MED ORDER — ONDANSETRON HCL 4 MG/2ML IJ SOLN: 4.0000 mg | INTRAMUSCULAR | Status: DC | PRN

## 2020-01-10 MED ORDER — ACETAMINOPHEN 325 MG PO TABS: 650.0000 mg | ORAL_TABLET | ORAL | Status: DC | PRN

## 2020-01-10 MED ORDER — LACTATED RINGERS IV SOLN: INTRAVENOUS | Status: DC

## 2020-01-10 MED ORDER — LACTATED RINGERS IV SOLN
500.0000 mL | Freq: Once | INTRAVENOUS | Status: AC
  Administered 2020-01-10: 500 mL via INTRAVENOUS

## 2020-01-10 MED ORDER — ONDANSETRON HCL 4 MG/2ML IJ SOLN
4.0000 mg | Freq: Four times a day (QID) | INTRAMUSCULAR | Status: DC | PRN
  Administered 2020-01-10 (×2): 4 mg via INTRAVENOUS
  Filled 2020-01-10 (×2): qty 2

## 2020-01-10 MED ORDER — PRENATAL MULTIVITAMIN CH
1.0000 | ORAL_TABLET | Freq: Every day | ORAL | Status: DC
  Administered 2020-01-11 – 2020-01-12 (×2): 1 via ORAL
  Filled 2020-01-10 (×2): qty 1

## 2020-01-10 MED ORDER — PHENYLEPHRINE 40 MCG/ML (10ML) SYRINGE FOR IV PUSH (FOR BLOOD PRESSURE SUPPORT)
80.0000 ug | PREFILLED_SYRINGE | INTRAVENOUS | Status: DC | PRN
  Administered 2020-01-10 (×2): 80 ug via INTRAVENOUS

## 2020-01-10 MED ORDER — CLINDAMYCIN PHOSPHATE 900 MG/50ML IV SOLN: 900.0000 mg | Freq: Three times a day (TID) | INTRAVENOUS | Status: DC

## 2020-01-10 MED ORDER — GENTAMICIN SULFATE 40 MG/ML IJ SOLN
5.0000 mg/kg | INTRAVENOUS | Status: DC
  Administered 2020-01-10: 350 mg via INTRAVENOUS
  Filled 2020-01-10: qty 8.75

## 2020-01-10 MED ORDER — FENTANYL-BUPIVACAINE-NACL 0.5-0.125-0.9 MG/250ML-% EP SOLN: 12.0000 mL/h | EPIDURAL | Status: DC | PRN

## 2020-01-10 MED ORDER — WITCH HAZEL-GLYCERIN EX PADS: 1.0000 "application " | MEDICATED_PAD | CUTANEOUS | Status: DC | PRN

## 2020-01-10 MED ORDER — OXYCODONE-ACETAMINOPHEN 5-325 MG PO TABS: 1.0000 | ORAL_TABLET | ORAL | Status: DC | PRN

## 2020-01-10 MED ORDER — TETANUS-DIPHTH-ACELL PERTUSSIS 5-2.5-18.5 LF-MCG/0.5 IM SUSP: 0.5000 mL | Freq: Once | INTRAMUSCULAR | Status: DC

## 2020-01-10 MED ORDER — SOD CITRATE-CITRIC ACID 500-334 MG/5ML PO SOLN: 30.0000 mL | ORAL | Status: DC | PRN

## 2020-01-10 MED ORDER — LIDOCAINE HCL (PF) 1 % IJ SOLN
INTRAMUSCULAR | Status: DC | PRN
  Administered 2020-01-10: 12 mL via EPIDURAL

## 2020-01-10 MED ORDER — SENNOSIDES-DOCUSATE SODIUM 8.6-50 MG PO TABS
2.0000 | ORAL_TABLET | ORAL | Status: DC
  Filled 2020-01-10 (×2): qty 2

## 2020-01-10 MED ORDER — OXYTOCIN BOLUS FROM INFUSION
333.0000 mL | Freq: Once | INTRAVENOUS | Status: AC
  Administered 2020-01-10: 333 mL via INTRAVENOUS

## 2020-01-10 MED ORDER — BENZOCAINE-MENTHOL 20-0.5 % EX AERO
1.0000 "application " | INHALATION_SPRAY | CUTANEOUS | Status: DC | PRN
  Filled 2020-01-10 (×2): qty 56

## 2020-01-10 MED ORDER — CLINDAMYCIN PHOSPHATE 900 MG/50ML IV SOLN
900.0000 mg | Freq: Three times a day (TID) | INTRAVENOUS | Status: AC
  Administered 2020-01-10 – 2020-01-11 (×2): 900 mg via INTRAVENOUS
  Filled 2020-01-10 (×2): qty 50

## 2020-01-10 MED ORDER — FLEET ENEMA 7-19 GM/118ML RE ENEM: 1.0000 | ENEMA | RECTAL | Status: DC | PRN

## 2020-01-10 MED ORDER — LACTATED RINGERS IV SOLN
500.0000 mL | INTRAVENOUS | Status: DC | PRN
  Administered 2020-01-10: 500 mL via INTRAVENOUS

## 2020-01-10 NOTE — Anesthesia Postprocedure Evaluation (Signed)
Anesthesia Post Note  Patient: Melanie Munoz, Melanie Munoz (IT)  Procedure(s) Performed: AN AD HOC LABOR EPIDURAL     Patient location during evaluation: Mother Baby Anesthesia Type: Epidural Level of consciousness: awake and alert Pain management: pain level controlled Vital Signs Assessment: post-procedure vital signs reviewed and stable Respiratory status: spontaneous breathing, nonlabored ventilation and respiratory function stable Cardiovascular status: stable Postop Assessment: no headache, no backache and epidural receding Anesthetic complications: no   No complications documented.  Last Vitals:  Vitals:   01/10/20 0846 01/10/20 0900  BP: 102/66 110/64  Pulse: (!) 117 (!) 122  Resp:    Temp:  36.9 C  SpO2:  100%    Last Pain:  Vitals:   01/10/20 0915  TempSrc:   PainSc: 0-No pain   Pain Goal:                   Kilani Joffe

## 2020-01-10 NOTE — Progress Notes (Signed)
ANTIBIOTIC CONSULT NOTE - INITIAL  Pharmacy Consult for Gentamicin Indication: Chorioamnionitis   Allergies  Allergen Reactions  . Morphine And Related     Patient feels like "my inside of my body is on fire"    Patient Measurements: Height: 5\' 6"  (167.6 cm) Weight: 88.4 kg (194 lb 12.8 oz) IBW/kg (Calculated) : 59.3 Adjusted Body Weight: 70.9 kg  Vital Signs: Temp: 100.9 F (38.3 C) (07/19 0532) Temp Source: Axillary (07/19 0532) BP: 105/66 (07/19 0630) Pulse Rate: 135 (07/19 0630)  Labs: Recent Labs    01/10/20 0100  WBC 12.3*  HGB 13.8  PLT 148*   No results for input(s): GENTTROUGH, GENTPEAK, GENTRANDOM in the last 72 hours.   Microbiology: Recent Results (from the past 720 hour(s))  OB RESULT CONSOLE Group B Strep     Status: None   Collection Time: 12/20/19 12:00 AM  Result Value Ref Range Status   GBS Negative  Final  SARS Coronavirus 2 by RT PCR (hospital order, performed in [facility] hospital lab) Nasopharyngeal Nasopharyngeal Swab     Status: None   Collection Time: 01/10/20 12:53 AM   Specimen: Nasopharyngeal Swab  Result Value Ref Range Status   SARS Coronavirus 2 NEGATIVE NEGATIVE Final    Comment: (NOTE) SARS-CoV-2 target nucleic acids are NOT DETECTED.  The SARS-CoV-2 RNA is generally detectable in upper and lower respiratory specimens during the acute phase of infection. The lowest concentration of SARS-CoV-2 viral copies this assay can detect is 250 copies / mL. A negative result does not preclude SARS-CoV-2 infection and should not be used as the sole basis for treatment or other patient management decisions.  A negative result may occur with improper specimen collection / handling, submission of specimen other than nasopharyngeal swab, presence of viral mutation(s) within the areas targeted by this assay, and inadequate number of viral copies (<250 copies / mL). A negative result must be combined with clinical observations, patient  history, and epidemiological information.  Fact Sheet for Patients:   01/12/20  Fact Sheet for Healthcare Providers: BoilerBrush.com.cy  This test is not yet approved or  cleared by the https://pope.com/ FDA and has been authorized for detection and/or diagnosis of SARS-CoV-2 by FDA under an Emergency Use Authorization (EUA).  This EUA will remain in effect (meaning this test can be used) for the duration of the COVID-19 declaration under Section 564(b)(1) of the Act, 21 U.S.C. section 360bbb-3(b)(1), unless the authorization is terminated or revoked sooner.  Performed at [facility], [address] [place], [place]     Medications:  clinda 900mg  Q8 hours and gentamicin 5mg /kg Q24 hours for chorioamnionitis  Assessment: 34 y.o. female G2P0101 at [redacted]w[redacted]d admitted with contractions, TOLAC.    Plan:   Gentamicin 5mg /kg Q24 hours  Check Scr with next labs if gentamicin continued. Will check gentamicin levels if continued > 72hr or clinically indicated.  Ruger Saxer Scarlett 01/10/2020,6:38 AM

## 2020-01-10 NOTE — Progress Notes (Signed)
Delivery Note At  0703a viable and healthy female was delivered over intact perinsum via vbac (Presentation:vtx ;OA  ).  APGAR: 9;9   .   Placenta status: delivered spontaneously ,intact .  Cord: 3vv with the following complications: loose nuchal x1, easily reduced prior to delivery of shoulders. During repair atony noted of LUS and small amount of clots expressed, fundus firm and bleeding resolved.  Cytotec placed.  Bleeding improved. About time of starting pushing, fetal tachy increasing and maternal temp.  Patient given tylenol and gentamycin and clindamycin started for chorioamnionitis. Anesthesia:  epidural Episiotomy:  none Lacerations:  vaginal, left labial, 2nd degree perineal Suture Repair: 3.0 vicryl Est. Blood Loss (mL):   Mom to postpartum.  Baby to Couplet care / Skin to Skin.  Vick Frees 01/10/2020, 7:51 AM

## 2020-01-10 NOTE — H&P (Signed)
Melanie Munoz is a 34 y.o. G2P0101 at [redacted]w[redacted]d gestation presents for complaint of Contractions.  Denies lof, +FM.   H/o ltcs, wants TOLAC.  Prior c/s d/t bleeding placenta previa. Antepartum course: h/o ltcs ; eif, echogenic bowel, cpc noted - abnormal quad but normal panorama (abnml quad felt d/t large sch); h/o large SCH, resolved  [facility] at [facility] since 12 wks.  See complete pre-natal records  History OB History    Gravida  2   Para  1   Term      Preterm  1   AB      Living  1     SAB      TAB      Ectopic      Multiple  0   Live Births  1          Past Medical History:  Diagnosis Date  . Medical history non-contributory    Past Surgical History:  Procedure Laterality Date  . APPENDECTOMY    . CESAREAN SECTION N/A 12/03/2016   Procedure: CESAREAN SECTION;  Surgeon: Noland Fordyce, MD;  Location: [facility] BIRTHING SUITES;  Service: Obstetrics;  Laterality: N/A;  . CESAREAN SECTION     Family History: family history includes Cancer in her father and maternal grandmother; Diabetes in her maternal grandfather and mother. Social History:  reports that she has never smoked. She has never used smokeless tobacco. She reports that she does not drink alcohol and does not use drugs.  ROS: See above otherwise negative  Prenatal labs:  ABO, Rh: --/--/A POS (07/19 0100) Antibody: NEG (07/19 0100) Rubella:  NI RPR:   reactive, treponemal test neg HBsAg:   nr HIV:  NR GBS:   neg 1 hr Glucola: had 3 hr gtt, wnl Genetic screening: Normal panorama, abnml quad Anatomy US: +cpc, eif, echogenic bowel  Physical Exam:   Dilation: 5.5 Effacement (%): 80 Station: -1 Exam by:: Kuwait Bodamer RN Blood pressure (!) 90/56, pulse (!) 102, temperature 98.2 F (36.8 C), temperature source Oral, resp. rate 18, height 5\' 6"  (1.676 m), weight 88.4 kg, last menstrual period 01/20/2019, SpO2 98 %, unknown if currently breastfeeding. A&O x 3 HEENT: Normal Lungs: CTAB CV:  RRR Abdominal: Soft, Non-tender, Gravid and Estimated fetal weight: 7 1/2 - 8lbs lbs  Lower Extremities: Non-edematous, Non-tender  Pelvic Exam:      Dilatation: 7cm     Effacement: 90%     Station: -1     Presentation: Cephalic, small clots noted, no active bleeding; arom with clear fluid  Labs:  CBC:  Lab Results  Component Value Date   WBC 12.3 (H) 01/10/2020   RBC 4.24 01/10/2020   HGB 13.8 01/10/2020   HCT 40.1 01/10/2020   MCV 94.6 01/10/2020   MCH 32.5 01/10/2020   MCHC 34.4 01/10/2020   RDW 12.0 01/10/2020   PLT 148 (L) 01/10/2020   CMP:  Lab Results  Component Value Date   NA 138 01/20/2019   K 3.5 01/20/2019   CL 102 01/20/2019   CO2 25 01/20/2019   GLUCOSE 132 (H) 01/20/2019   BUN 7 01/20/2019   CREATININE 0.79 01/20/2019   CALCIUM 8.6 (L) 01/20/2019   PROT 8.0 01/20/2019   AST 22 01/20/2019   ALT 15 01/20/2019   ALBUMIN 4.2 01/20/2019   ALKPHOS 51 01/20/2019   BILITOT 0.8 01/20/2019   GFRNONAA >60 01/20/2019   GFRAA >60 01/20/2019   ANIONGAP 11 01/20/2019   Urine: Lab Results  Component Value  Date   COLORURINE YELLOW 01/21/2019   APPEARANCEUR HAZY (A) 01/21/2019   LABSPEC 1.018 01/21/2019   PHURINE 6.0 01/21/2019   GLUCOSEU NEGATIVE 01/21/2019   HGBUR NEGATIVE 01/21/2019   BILIRUBINUR NEGATIVE 01/21/2019   KETONESUR 80 (A) 01/21/2019   PROTEINUR 100 (A) 01/21/2019   NITRITE NEGATIVE 01/21/2019   LEUKOCYTESUR NEGATIVE 01/21/2019     Prenatal Transfer Tool  Maternal Diabetes: No Genetic Screening: Normal panorama Maternal Ultrasounds/Referrals: Isolated EIF (echogenic intracardiac focus), Isolated choroid plexus cyst and Echogenic bowel Fetal Ultrasounds or other Referrals:  None Maternal Substance Abuse:  No Significant Maternal Medications:  None Significant Maternal Lab Results: Group B Strep negative  Fht: 150s-160s, nml variability with periods of decreased variability, +accels, no decels Toco: q 2 min  Assessment/Plan:  34  y.o. G2P0101 at [redacted]w[redacted]d gestation   1. Active labor - h/o ltcs, wants tolac-pt accepts risk of uterine rupture though risk is less than 1%; making good progress, anticipate vbac 2. Fetal status reassuring, will monitor closely  3. gbs neg 4. Rh pos 5. Rubella NI  Vick Frees 01/10/2020, 2:42 AM

## 2020-01-10 NOTE — MAU Provider Note (Signed)
First Provider Initiated Contact with Patient 01/10/20 0010     S: [name] is a 34 y.o. G2P0101 at [redacted]w[redacted]d  who presents to MAU today complaining of leaking of fluid since 2230. She denies vaginal bleeding. She endorses contractions which started at 2100 and have increased in intensity since that time. She reports normal fetal movement.  Patient intermittently vomiting with contractions  She receives care with [facility] and desires TOLAC. Hx primary cesarean in 2018 for active bleeding in setting of placenta previa.  O: BP 123/78   Pulse 88   Temp 98.7 F (37.1 C) (Oral)   Resp (!) 21   LMP 01/20/2019    GENERAL: Well-developed, well-nourished female in no acute distress.  HEAD: Normocephalic, atraumatic.  CHEST: Normal effort of breathing, regular heart rate ABDOMEN: Soft, nontender, gravid PELVIC: Normal external female genitalia. Vagina is pink and rugated. Cervix with normal contour, no lesions. Normal discharge.  Negative pooling. Small amount of blood discharge noted  Cervical exam:  Dilation: 3 Effacement (%): 80 Station: -2 Presentation: Vertex Exam by:: Karl Ito, rnc    Fetal Monitoring: Baseline: 155 Variability: Mod Accelerations: 15 x 15 Decelerations: None Contractions: Moderate to strong q 2-4 min   A: SIUP at [redacted]w[redacted]d  Intact membranes (negative pooling, negative fern) Cervical change from 3cm to 4-5cm in 60 minutes of evaluation in MAU  P: Active labor, for TOLAC Per Dr. Amado Nash, admit to L&D  Clayton Bibles, CNM 01/10/2020 1:00 AM

## 2020-01-10 NOTE — Anesthesia Preprocedure Evaluation (Signed)
Anesthesia Evaluation  Patient identified by MRN, date of birth, ID band Patient awake    Reviewed: Allergy & Precautions, NPO status , Patient's Chart, lab work & pertinent test results  Airway Mallampati: II  TM Distance: >3 FB Neck ROM: Full    Dental no notable dental hx.    Pulmonary neg pulmonary ROS,    Pulmonary exam normal breath sounds clear to auscultation       Cardiovascular negative cardio ROS Normal cardiovascular exam Rhythm:Regular Rate:Normal     Neuro/Psych negative neurological ROS  negative psych ROS   GI/Hepatic negative GI ROS, Neg liver ROS,   Endo/Other  negative endocrine ROS  Renal/GU negative Renal ROS  negative genitourinary   Musculoskeletal negative musculoskeletal ROS (+)   Abdominal   Peds  Hematology negative hematology ROS (+)   Anesthesia Other Findings TOLAC  Reproductive/Obstetrics (+) Pregnancy                             Anesthesia Physical Anesthesia Plan  ASA: II  Anesthesia Plan: Epidural   Post-op Pain Management:    Induction:   PONV Risk Score and Plan: Treatment may vary due to age or medical condition  Airway Management Planned: Natural Airway  Additional Equipment:   Intra-op Plan:   Post-operative Plan:   Informed Consent: I have reviewed the patients History and Physical, chart, labs and discussed the procedure including the risks, benefits and alternatives for the proposed anesthesia with the patient or authorized representative who has indicated his/her understanding and acceptance.       Plan Discussed with: Anesthesiologist  Anesthesia Plan Comments: (Patient identified. Risks, benefits, options discussed with patient including but not limited to bleeding, infection, nerve damage, paralysis, failed block, incomplete pain control, headache, blood pressure changes, nausea, vomiting, reactions to medication, itching,  and post partum back pain. Confirmed with bedside nurse the patient's most recent platelet count. Confirmed with the patient that they are not taking any anticoagulation, have any bleeding history or any family history of bleeding disorders. Patient expressed understanding and wishes to proceed. All questions were answered. )        Anesthesia Quick Evaluation  

## 2020-01-10 NOTE — MAU Note (Signed)
Started having UC at 2100, possible ROM at 2230, endorses positive fm, previous c/s for previa

## 2020-01-10 NOTE — Lactation Note (Signed)
This note was copied from a baby's chart. Lactation Consultation Note  Patient Name: Girl Genevie Elman Today's Date: 01/10/2020  P2, 9 hour term female infant. Dad was laying in bed with infant while mom in bathroom. LC will return later for [facility] services will return later for [facility] consult.    Maternal Data    Feeding    LATCH Score                   Interventions    Lactation Tools Discussed/Used     Consult Status      Danelle Earthly 01/10/2020, 4:26 PM

## 2020-01-10 NOTE — Anesthesia Procedure Notes (Signed)
Epidural Patient location during procedure: OB Start time: 01/10/2020 1:30 AM End time: 01/10/2020 1:45 AM  Staffing Anesthesiologist: Elmer Picker, MD Performed: anesthesiologist   Preanesthetic Checklist Completed: patient identified, IV checked, risks and benefits discussed, monitors and equipment checked, pre-op evaluation and timeout performed  Epidural Patient position: sitting Prep: DuraPrep and site prepped and draped Patient monitoring: continuous pulse ox, blood pressure, heart rate and cardiac monitor Approach: midline Location: L3-L4 Injection technique: LOR air  Needle:  Needle type: Tuohy  Needle gauge: 17 G Needle length: 9 cm Needle insertion depth: 6 cm Catheter type: closed end flexible Catheter size: 19 Gauge Catheter at skin depth: 12 cm Test dose: negative  Assessment Sensory level: T8 Events: blood not aspirated, injection not painful, no injection resistance, no paresthesia and negative IV test  Additional Notes Patient identified. Risks/Benefits/Options discussed with patient including but not limited to bleeding, infection, nerve damage, paralysis, failed block, incomplete pain control, headache, blood pressure changes, nausea, vomiting, reactions to medication both or allergic, itching and postpartum back pain. Confirmed with bedside nurse the patient's most recent platelet count. Confirmed with patient that they are not currently taking any anticoagulation, have any bleeding history or any family history of bleeding disorders. Patient expressed understanding and wished to proceed. All questions were answered. Sterile technique was used throughout the entire procedure. Please see nursing notes for vital signs. Test dose was given through epidural catheter and negative prior to continuing to dose epidural or start infusion. Warning signs of high block given to the patient including shortness of breath, tingling/numbness in hands, complete motor block,  or any concerning symptoms with instructions to call for help. Patient was given instructions on fall risk and not to get out of bed. All questions and concerns addressed with instructions to call with any issues or inadequate analgesia.  Reason for block:procedure for pain

## 2020-01-10 NOTE — Progress Notes (Signed)
Comfortable with epidural  Patient Vitals for the past 24 hrs:  BP Temp Temp src Pulse Resp SpO2 Height Weight  01/10/20 0340 (!) 97/49 -- -- 94 -- -- -- --  01/10/20 0335 (!) 104/57 -- -- 92 -- -- -- --  01/10/20 0330 (!) 87/72 -- -- 89 -- -- -- --  01/10/20 0323 106/71 -- -- (!) 133 -- -- -- --  01/10/20 0320 106/71 -- -- (!) 133 -- -- -- --  01/10/20 0317 (!) 86/49 -- -- 90 -- -- -- --  01/10/20 0310 104/78 -- -- 78 -- -- -- --  01/10/20 0300 (!) 90/33 -- -- 88 -- -- -- --  01/10/20 0230 (!) 90/56 -- -- (!) 102 -- 98 % -- --  01/10/20 0210 101/64 -- -- (!) 142 -- 98 % -- --  01/10/20 0208 113/75 98.2 F (36.8 C) Oral 95 18 100 % 5\' 6"  (1.676 m) 88.4 kg  01/10/20 0205 113/75 -- -- (!) 231 -- 99 % -- --  01/10/20 0200 104/62 -- -- 92 -- 98 % -- --  01/10/20 0155 105/75 -- -- 85 -- 97 % -- --  01/10/20 0150 104/63 -- -- 93 -- 97 % -- --  01/10/20 0145 (!) 136/100 -- -- (!) 211 -- 98 % -- --  01/10/20 0140 118/69 -- -- 94 -- 98 % -- --  01/10/20 0116 109/67 -- -- 89 18 -- -- --  01/10/20 0001 123/78 98.7 F (37.1 C) Oral 88 (!) 21 -- -- --   A&ox3 nml respirations Abd: soft, nt, gravid Cx: c/c/0;OP  FHT: 150s, nml to decreased variability, +accels, +occasional early TOCO: q1-2 min  A/p: iup at 39.4 wga 1. Active labor - h/o ltcs, wants tolac; will labor down and position changes to help rotate and help fetus descend, plan vbac 2. Fetal status reassuring, contin to follow closely 3. gbs neg 4. Rh pos 5. abnml quad, nml panorama; echogenic bowel/efi/cpc noted

## 2020-01-10 NOTE — Progress Notes (Signed)
Transferred to mother baby

## 2020-01-11 LAB — SURGICAL PATHOLOGY

## 2020-01-11 MED ORDER — IBUPROFEN 600 MG PO TABS: 600.0000 mg | ORAL_TABLET | Freq: Four times a day (QID) | ORAL | 0 refills | Status: DC

## 2020-01-11 MED ORDER — ACETAMINOPHEN 325 MG PO TABS: 650.0000 mg | ORAL_TABLET | ORAL | 1 refills | Status: DC | PRN

## 2020-01-11 MED ORDER — COCONUT OIL OIL: 1.0000 "application " | TOPICAL_OIL | 0 refills | Status: DC | PRN

## 2020-01-11 NOTE — Lactation Note (Signed)
This note was copied from a baby's chart. Lactation Consultation Note  Patient Name: Melanie Munoz Today's Date: 01/11/2020 Reason for consult: Initial assessment   Mother is a P2, infant is 70 hours old   Mother was given [facility] brochure and basic teaching done.  Mother reports that infant is feeding well.  Reviewed hand expression with mother.mother reports that she hand expresses well. Mother reports that infant feeds much better than first child. . .   Discussed treatment and prevention of engorgement. Mother plans to be discharged  This pm . Mother denies having any questions or concerns.   Mother to continue to cue base feed infant and feed at least 8-12 times or more in 24 hours and advised to allow for cluster feeding infant as needed.   Mother to continue to due STS. Mother is aware of available LC services at [facility], BFSG'S, OP Dept, and phone # for questions or concerns about breastfeeding.  Mother receptive to all teaching and plan of care.     Maternal Data Has patient been taught Hand Expression?: Yes Does the patient have breastfeeding experience prior to this delivery?: Yes  Feeding Feeding Type: Breast Fed  LATCH Score                   Interventions Interventions: Breast feeding basics reviewed  Lactation Tools Discussed/Used     Consult Status Consult Status: Complete    Michel Bickers 01/11/2020, 3:23 PM

## 2020-01-11 NOTE — Progress Notes (Signed)
PPD # 1 S/P VBAC  Live born female  Birth Weight: 8 lb 7.6 oz (3845 g) APGAR: 9, 9  Newborn Delivery   Birth date/time: 01/10/2020 07:03:00 Delivery type: VBAC, Spontaneous     Baby name: Melanie Munoz Delivering provider: Rhoderick Moody E  Episiotomy:None   Lacerations:Vaginal;Labial   Feeding: breast  Pain control at delivery: Epidural   S:  Reports feeling well             Tolerating po/ No nausea or vomiting             Bleeding is moderate             Pain controlled with acetaminophen and ibuprofen (OTC)             Up ad lib / ambulatory / voiding without difficulties   O:  A & O x 3, in no apparent distress              VS:  Vitals:   01/10/20 1814 01/10/20 2300 01/11/20 0534 01/11/20 2100  BP: 115/71 102/66 97/68 96/62   Pulse: 100 82 83 72  Resp: 17 16 16    Temp: 98.2 F (36.8 C) 98.4 F (36.9 C) 98.1 F (36.7 C) 98.6 F (37 C)  TempSrc: Oral Oral Oral Oral  SpO2: 100% 100% 100% 99%  Weight:      Height:        LABS:  Recent Labs    01/10/20 0100 01/11/20 0535  WBC 12.3* 16.7*  HGB 13.8 10.5*  HCT 40.1 31.3*  PLT 148* 147*    Blood type: --/--/A POS (07/19 0100)  Rubella: Immune (01/15 0000)   I&O: I/O last 3 completed shifts: In: -  Out: 408 [Blood:408]          No intake/output data recorded.  Vaccines: TDaP UTD         Flu    Declined   Gen: AAO x 3, NAD  Abdomen: soft, non-tender, non-distended             Fundus: firm, non-tender, U-1  Perineum: healing, no edema or hematoma  Lochia: moderate  Extremities: no edema, no calf pain or tenderness   A/P: PPD # 1 34 y.o., 08-01-1984   Principal Problem:   Postpartum care following VBAC 7/19 Active Problems:   Indication for care in labor or delivery   VBAC, delivered   Perineal laceration, second degree, vaginal, left labial   Chorioamnionitis   Doing well - stable status             Antibiotics 24 hours complete, pt afebrile  Routine post partum orders             Pt desired early  discharge today, baby remains inpatient and mother elects to remain inpatient  Anticipate discharge tomorrow   8/19, MSN, CNM 01/11/2020, 10:18 PM

## 2020-01-11 NOTE — Discharge Summary (Signed)
OB Discharge Summary  Patient Name: Melanie Munoz DOB: May 18, 1986 MRN: [id number]  Date of admission: 01/09/2020 Delivering provider: Rhoderick Moody E   Admitting diagnosis: Indication for care in labor or delivery [O75.9] Intrauterine pregnancy: [redacted]w[redacted]d     Secondary diagnosis: Patient Active Problem List   Diagnosis Date Noted  . Indication for care in labor or delivery 01/10/2020  . VBAC, delivered 01/10/2020  . Perineal laceration, second degree, vaginal, left labial 01/10/2020  . Chorioamnionitis 01/10/2020  . Postpartum care following VBAC 7/19 12/03/2016  . S/P cesarean section (6/12) Indication: Bleeding placenta previa  12/03/2016  . Placenta previa antepartum in third trimester 11/18/2016  . Placenta previa antepartum, third trimester 11/09/2016   Date of discharge: 01/11/2020   Discharge diagnosis: Principal Problem:   Postpartum care following VBAC 7/19 Active Problems:   Indication for care in labor or delivery   VBAC, delivered   Perineal laceration, second degree, vaginal, left labial   Chorioamnionitis                                                           Post partum procedures:None  Augmentation: AROM Pain control: Epidural  Laceration:Vaginal;Labial  Episiotomy:None  Complications: Intrauterine Inflammation or infection (Chorioamniotis)  Hospital course:  Onset of Labor With Vaginal Delivery      34 y.o. yo [id number] at [redacted]w[redacted]d was admitted in Active Labor on 01/09/2020. Patient had an uncomplicated labor course as follows:  Membrane Rupture Time/Date: 2:48 AM ,01/10/2020   Delivery Method:VBAC, Spontaneous  Episiotomy: None  Lacerations:  Vaginal;Labial  Patient was diagnosed with chorioamnionitis after delivery and received 24 hours of IV antibiotics. She has been afebrile for 24 hours following delivery. She is ambulating, tolerating a regular diet, passing flatus, and urinating well. Patient is discharged home in stable condition on 01/11/20.  Newborn  Data: Birth date:01/10/2020  Birth time:7:03 AM  Gender:Female  Living status:Living  Apgars:9 ,9  Weight:3845 g   Physical exam  Vitals:   01/10/20 1100 01/10/20 1814 01/10/20 2300 01/11/20 0534  BP: 122/76 115/71 102/66 97/68  Pulse: 96 100 82 83  Resp: 18 17 16 16   Temp: 98.9 F (37.2 C) 98.2 F (36.8 C) 98.4 F (36.9 C) 98.1 F (36.7 C)  TempSrc: Oral Oral Oral Oral  SpO2:  100% 100% 100%  Weight:      Height:       General: alert, cooperative and no distress Lochia: appropriate Uterine Fundus: firm Perineum: repair intact, no edema DVT Evaluation: No evidence of DVT seen on physical exam. Labs: Lab Results  Component Value Date   WBC 16.7 (H) 01/11/2020   HGB 10.5 (L) 01/11/2020   HCT 31.3 (L) 01/11/2020   MCV 98.7 01/11/2020   PLT 147 (L) 01/11/2020   CMP Latest Ref Rng & Units 01/20/2019  Glucose 70 - 99 mg/dL 01/22/2019)  BUN 6 - 20 mg/dL 7  Creatinine 570(V - 7.79 mg/dL 3.90  Sodium 3.00 - 923 mmol/L 138  Potassium 3.5 - 5.1 mmol/L 3.5  Chloride 98 - 111 mmol/L 102  CO2 22 - 32 mmol/L 25  Calcium 8.9 - 10.3 mg/dL 300)  Total Protein 6.5 - 8.1 g/dL 8.0  Total Bilirubin 0.3 - 1.2 mg/dL 0.8  Alkaline Phos 38 - 126 U/L 51  AST 15 - 41 U/L 22  ALT 0 - 44 U/L 15   No flowsheet data found. Vaccines: TDaP UTD         Flu    Declined  Discharge instruction:  per After Visit Summary,  [facility] booklet and  "Understanding Mother & Baby Care" hospital booklet  After Visit Meds:  Allergies as of 01/11/2020      Reactions   Morphine And Related    Patient feels like "my inside of my body is on fire"      Medication List    STOP taking these medications   amoxicillin-clavulanate 875-125 MG tablet Commonly known as: Augmentin   azithromycin 250 MG tablet Commonly known as: ZITHROMAX   cetirizine 10 MG tablet Commonly known as: ZYRTEC   triamcinolone 55 MCG/ACT Aero nasal inhaler Commonly known as: NASACORT   vitamin C 1000 MG tablet     TAKE  these medications   acetaminophen 325 MG tablet Commonly known as: Tylenol Take 2 tablets (650 mg total) by mouth every 4 (four) hours as needed (for pain scale < 4).   coconut oil Oil Apply 1 application topically as needed.   ibuprofen 600 MG tablet Commonly known as: ADVIL Take 1 tablet (600 mg total) by mouth every 6 (six) hours.   prenatal multivitamin Tabs tablet Take 1 tablet by mouth daily at 12 noon.            Discharge Care Instructions  (From admission, onward)         Start     Ordered   01/11/20 0000  Discharge wound care:       Comments: Sitz baths 2 times /day with warm water x 1 week. May add herbals: 1 ounce dried comfrey leaf* 1 ounce calendula flowers 1 ounce lavender flowers  Supplies can be found online at [employer] sources at [employer], [employer]  1/2 ounce dried uva ursi leaves 1/2 ounce witch hazel blossoms (if you can find them) 1/2 ounce dried sage leaf 1/2 cup sea salt Directions: Bring 2 quarts of water to a boil. Turn off heat, and place 1 ounce (approximately 1 large handful) of the above mixed herbs (not the salt) into the pot. Steep, covered, for 30 minutes.  Strain the liquid well with a fine mesh strainer, and discard the herb material. Add 2 quarts of liquid to the tub, along with the 1/2 cup of salt. This medicinal liquid can also be made into compresses and peri-rinses.   01/11/20 0935         Diet: routine diet  Activity: Advance as tolerated. Pelvic rest for 6 weeks.   Postpartum contraception: Not Discussed  Newborn Data: Live born female  Birth Weight: 8 lb 7.6 oz (3845 g) APGAR: 9, 9  Newborn Delivery   Birth date/time: 01/10/2020 07:03:00 Delivery type: VBAC, Spontaneous     Named Caitlyn Baby Feeding: Breast Disposition:home with mother  Delivery Report:  Review the Delivery Report for details.    Follow up:  Follow-up Information    Noland Fordyce, MD. Schedule an appointment as  soon as possible for a visit in 6 week(s).   Specialty: Obstetrics and Gynecology Why: Please make an appointment for 6 week postpartum visit.  Contact information: [address] [place] [address] [phone]              Signed: Clancy Gourd, MSN 01/11/2020, 9:35 AM

## 2020-01-12 LAB — CBC
HCT: 31.3 % — ABNORMAL LOW (ref 36.0–46.0)
Hemoglobin: 10.5 g/dL — ABNORMAL LOW (ref 12.0–15.0)
MCH: 33.1 pg (ref 26.0–34.0)
MCHC: 33.5 g/dL (ref 30.0–36.0)
MCV: 98.7 fL (ref 80.0–100.0)
Platelets: 147 10*3/uL — ABNORMAL LOW (ref 150–400)
RBC: 3.17 MIL/uL — ABNORMAL LOW (ref 3.87–5.11)
RDW: 12.7 % (ref 11.5–15.5)
WBC: 16.7 10*3/uL — ABNORMAL HIGH (ref 4.0–10.5)
nRBC: 0 % (ref 0.0–0.2)

## 2020-01-12 MED ORDER — FERROUS SULFATE 325 (65 FE) MG PO TABS: 325.0000 mg | ORAL_TABLET | Freq: Every day | ORAL | 3 refills | Status: DC

## 2020-01-12 NOTE — Discharge Summary (Signed)
Postpartum Discharge Summary  Date of Service updated 01/12/2020     Patient Name: Melanie Munoz DOB: Nov 17, 1985 MRN: [id number]  Date of admission: 01/09/2020 Delivery date:01/10/2020  Delivering provider: Tiana Loft E  Date of discharge: 01/12/2020  Admitting diagnosis: Indication for care in labor or delivery [O75.9] Intrauterine pregnancy: [redacted]w[redacted]d    Secondary diagnosis:  Principal Problem:   Postpartum care following VBAC 7/19 Active Problems:   Indication for care in labor or delivery   VBAC, delivered   Perineal laceration, second degree, vaginal, left labial   Chorioamnionitis  Additional problems: ABL Anemia     Discharge diagnosis: Term Pregnancy Delivered, VBAC and Anemia, gestational thrombocytopenia                                               Post partum procedures: n/a Augmentation: AROM Complications: Intrauterine Inflammation or infection (Chorioamniotis)  Hospital course: Onset of Labor With Vaginal Delivery      34y.o. yo [id number] admitted in Latent Labor on 01/09/2020. Patient had an uncomplicated labor course as follows:  Membrane Rupture Time/Date: 2:48 AM ,01/10/2020   Delivery Method:VBAC, Spontaneous  Episiotomy: None  Lacerations:  Vaginal;Labial  Patient had an uncomplicated postpartum course.  She is ambulating, tolerating a regular diet, passing flatus, and urinating well. Patient is discharged home in stable condition on 01/12/20.  Newborn Data: Birth date:01/10/2020  Birth time:7:03 AM  Gender:Female  Living status:Living  Apgars:9 ,9  Weight:3845 g   Magnesium Sulfate received: No BMZ received: No Rhophylac:N/A MMR:N/A T-DaP:Given prenatally Flu: N/A Transfusion:No  Physical exam  Vitals:   01/10/20 2300 01/11/20 0534 01/11/20 2100 01/12/20 0500  BP: 1'02/66 97/68 96/62 ' 98/65  Pulse: 82 83 72 83  Resp: 16 16    Temp: 98.4 F (36.9 C) 98.1 F (36.7 C) 98.6 F (37 C) 98.2 F (36.8 C)  TempSrc: Oral Oral Oral  Oral  SpO2: 100% 100% 99% 99%  Weight:      Height:       General: alert, cooperative and no distress Lochia: appropriate Uterine Fundus: firm Perineum: intact; mild edema DVT Evaluation: No evidence of DVT seen on physical exam. No significant calf/ankle edema. Labs: Lab Results  Component Value Date   WBC 16.7 (H) 01/11/2020   HGB 10.5 (L) 01/11/2020   HCT 31.3 (L) 01/11/2020   MCV 98.7 01/11/2020   PLT 147 (L) 01/11/2020   CMP Latest Ref Rng & Units 01/20/2019  Glucose 70 - 99 mg/dL 132(H)  BUN 6 - 20 mg/dL 7  Creatinine 0.44 - 1.00 mg/dL 0.79  Sodium 135 - 145 mmol/L 138  Potassium 3.5 - 5.1 mmol/L 3.5  Chloride 98 - 111 mmol/L 102  CO2 22 - 32 mmol/L 25  Calcium 8.9 - 10.3 mg/dL 8.6(L)  Total Protein 6.5 - 8.1 g/dL 8.0  Total Bilirubin 0.3 - 1.2 mg/dL 0.8  Alkaline Phos 38 - 126 U/L 51  AST 15 - 41 U/L 22  ALT 0 - 44 U/L 15   Edinburgh Score: Edinburgh Postnatal Depression Scale Screening Tool 01/12/2020  I have been able to laugh and see the funny side of things. 0  I have looked forward with enjoyment to things. 0  I have blamed myself unnecessarily when things went wrong. 1  I have been anxious or worried for no good reason. 0  I have felt scared or panicky for no good reason. 1  Things have been getting on top of me. 1  I have been so unhappy that I have had difficulty sleeping. 1  I have felt sad or miserable. 1  I have been so unhappy that I have been crying. 1  The thought of harming myself has occurred to me. 0  Edinburgh Postnatal Depression Scale Total 6      After visit meds:  Allergies as of 01/12/2020      Reactions   Morphine And Related    Patient feels like "my inside of my body is on fire"      Medication List    STOP taking these medications   amoxicillin-clavulanate 875-125 MG tablet Commonly known as: Augmentin   azithromycin 250 MG tablet Commonly known as: ZITHROMAX   cetirizine 10 MG tablet Commonly known as: ZYRTEC    triamcinolone 55 MCG/ACT Aero nasal inhaler Commonly known as: NASACORT   vitamin C 1000 MG tablet     TAKE these medications   acetaminophen 325 MG tablet Commonly known as: Tylenol Take 2 tablets (650 mg total) by mouth every 4 (four) hours as needed (for pain scale < 4).   coconut oil Oil Apply 1 application topically as needed.   ferrous sulfate 325 (65 FE) MG tablet Take 1 tablet (325 mg total) by mouth daily.   ibuprofen 600 MG tablet Commonly known as: ADVIL Take 1 tablet (600 mg total) by mouth every 6 (six) hours.   prenatal multivitamin Tabs tablet Take 1 tablet by mouth daily at 12 noon.            Discharge Care Instructions  (From admission, onward)         Start     Ordered   01/11/20 0000  Discharge wound care:       Comments: Sitz baths 2 times /day with warm water x 1 week. May add herbals: 1 ounce dried comfrey leaf* 1 ounce calendula flowers 1 ounce lavender flowers  Supplies can be found online at [employer] sources at [employer], [employer]  1/2 ounce dried uva ursi leaves 1/2 ounce witch hazel blossoms (if you can find them) 1/2 ounce dried sage leaf 1/2 cup sea salt Directions: Bring 2 quarts of water to a boil. Turn off heat, and place 1 ounce (approximately 1 large handful) of the above mixed herbs (not the salt) into the pot. Steep, covered, for 30 minutes.  Strain the liquid well with a fine mesh strainer, and discard the herb material. Add 2 quarts of liquid to the tub, along with the 1/2 cup of salt. This medicinal liquid can also be made into compresses and peri-rinses.   01/11/20 0935           Discharge home in stable condition Infant Feeding: Breast Infant Disposition:home with mother Discharge instruction: per After Visit Summary and Postpartum booklet. Activity: Advance as tolerated. Pelvic rest for 6 weeks.  Diet: low salt diet Anticipated Birth Control: Unsure Postpartum Appointment:6  weeks Additional Postpartum F/U: Postpartum Depression checkup Future Appointments:No future appointments. Follow up Visit:  Follow-up Information    Aloha Gell, MD. Schedule an appointment as soon as possible for a visit in 6 week(s).   Specialty: Obstetrics and Gynecology Why: Please make an appointment for 6 week postpartum visit.  Contact information: [place] [address] [phone]  01/12/2020 Darliss Cheney, CNM

## 2020-01-12 NOTE — Lactation Note (Signed)
This note was copied from a baby's chart. Lactation Consultation Note  Patient Name: Melanie Munoz Today's Date: 01/12/2020   Mom states she has already had a lactation visit. She cites no questions or concerns, saying that "nursing is going really good."   Lurline Hare [facility] 01/12/2020, 8:31 AM

## 2021-03-28 LAB — OB RESULTS CONSOLE ABO/RH: RH Type: POSITIVE

## 2021-03-28 LAB — OB RESULTS CONSOLE HIV ANTIBODY (ROUTINE TESTING): HIV: NONREACTIVE

## 2021-03-28 LAB — OB RESULTS CONSOLE GC/CHLAMYDIA
Chlamydia: NEGATIVE
Gonorrhea: NEGATIVE

## 2021-03-28 LAB — OB RESULTS CONSOLE RPR: RPR: NONREACTIVE

## 2021-03-28 LAB — OB RESULTS CONSOLE RUBELLA ANTIBODY, IGM: Rubella: NON-IMMUNE/NOT IMMUNE

## 2021-03-28 LAB — OB RESULTS CONSOLE HEPATITIS B SURFACE ANTIGEN: Hepatitis B Surface Ag: NEGATIVE

## 2021-03-28 LAB — OB RESULTS CONSOLE ANTIBODY SCREEN: Antibody Screen: NEGATIVE

## 2021-04-11 ENCOUNTER — Other Ambulatory Visit: Payer: Self-pay | Admitting: Obstetrics

## 2021-04-11 DIAGNOSIS — Q998 Other specified chromosome abnormalities: Secondary | ICD-10-CM

## 2021-04-18 ENCOUNTER — Encounter: Payer: Self-pay | Admitting: *Deleted

## 2021-04-20 ENCOUNTER — Ambulatory Visit (HOSPITAL_BASED_OUTPATIENT_CLINIC_OR_DEPARTMENT_OTHER): Payer: BC Managed Care – PPO

## 2021-04-20 ENCOUNTER — Ambulatory Visit: Payer: BC Managed Care – PPO | Attending: Obstetrics

## 2021-04-20 ENCOUNTER — Other Ambulatory Visit: Payer: Self-pay | Admitting: *Deleted

## 2021-04-20 ENCOUNTER — Ambulatory Visit: Payer: BC Managed Care – PPO | Admitting: *Deleted

## 2021-04-20 ENCOUNTER — Other Ambulatory Visit: Payer: Self-pay

## 2021-04-20 VITALS — BP 108/63 | HR 79

## 2021-04-20 DIAGNOSIS — Z3A13 13 weeks gestation of pregnancy: Secondary | ICD-10-CM | POA: Diagnosis not present

## 2021-04-20 DIAGNOSIS — O09291 Supervision of pregnancy with other poor reproductive or obstetric history, first trimester: Secondary | ICD-10-CM

## 2021-04-20 DIAGNOSIS — O34219 Maternal care for unspecified type scar from previous cesarean delivery: Secondary | ICD-10-CM | POA: Diagnosis not present

## 2021-04-20 DIAGNOSIS — O280 Abnormal hematological finding on antenatal screening of mother: Secondary | ICD-10-CM

## 2021-04-20 DIAGNOSIS — O09522 Supervision of elderly multigravida, second trimester: Secondary | ICD-10-CM | POA: Diagnosis not present

## 2021-04-20 DIAGNOSIS — Q998 Other specified chromosome abnormalities: Secondary | ICD-10-CM | POA: Insufficient documentation

## 2021-04-20 NOTE — Progress Notes (Signed)
Name: Engineer, [name] Indication: Atypical finding on NIPS  DOB: 1986/02/23 Age: 35 y.o.   EDC: 10/21/2021 LMP: 01/14/2021 Referring Provider: Noland Fordyce, MD  EGA: [redacted]w[redacted]d Genetic Counselor: Teena Dunk, MS, CGC  OB Hx: [id number] Date of Appointment: 04/20/2021  Accompanied by: Malissa Hippo Face to Face Time: 30 Minutes   Previous Testing Completed: CBC from 03/28/2021 reviewed. MCV within normal limits. It is unlikely that Kallyn is a beta thalassemia carrier or an alpha thalassemia carrier of the double-gene deletion. Individuals with a normal MCV may be single-gene deletion carriers, but it is unlikely that the current pregnancy would be affected with alpha or beta thalassemia major.   Medical & Family History:  This is Ethie's 3rd pregnancy. She reports she has a healthy son and daughter.  Reports she takes zyrtec and diclegis. Reports bleeding at 4 and 8 weeks which has resolved.  Denies personal history of diabetes, high blood pressure, thyroid conditions, and seizures. Denies infections and fevers in this pregnancy. Denies using tobacco, alcohol, or street drugs in this pregnancy.  Maternal ethnicity reported as Caucasian and paternal ethnicity reported as Caucasian. Denies Ashkenazi Jewish ancestry. Family history not remarkable for consanguinity, individuals with birth defects, intellectual disability, autism spectrum disorder, multiple spontaneous abortions, still births, or unexplained neonatal death.     Genetic Counseling:   Atypical Non-Invasive Prenatal Screen Result. Ellia previously completed Non-Invasive Prenatal Screening (NIPS) in this pregnancy (scanned into Epic under the Media tab). The result is low risk for Down syndrome, Trisomy 18, and Trisomy 13. There was no result reported for sex chromosome abnormalities due to a suspected atypical finding. The laboratory reports a suspected finding outside the scope of the test. The origin of the atypical finding, which  involves the X chromosome, could not be specified and may include, but is not limited to, fetal/maternal mosaicism, chromosome abnormality, or normal variation. Fetal risk assessment for monosomy X and fetal sex could not be performed. Genetic counseling reviewed this result with Lezley in detail. The common features of some sex chromosome conditions was also discussed. Given the atypical result, Harmonee was offered CVS/amniocentesis for fetal chromosome analysis and microarray studies. Elleah was also offered maternal blood chromosome analysis. Larisha declined CVS/amniocentesis for prenatal diagnosis and also declined the maternal blood chromosome analysis at this time.  Birth Defects. All babies have approximately a 3-5% risk for a birth defect and a majority of these defects cannot be detected through the screening or diagnostic testing listed below. Ultrasound may detect some birth defects, but it may not detect all birth defects. About half of pregnancies with Down syndrome do not show any soft markers on ultrasound. A normal ultrasound does not guarantee a healthy pregnancy.   Testing/Screening Options:   CVS/Amniocentesis. These procedures are available for prenatal diagnosis. Possible procedural difficulties and complications that can arise with these procedures include maternal infection, cramping, bleeding, fluid leakage, and/or pregnancy loss. The risk for pregnancy loss with a CVS is 1/300-500. The risk for pregnancy loss with an amniocentesis is 1/500-1,000. Per the [employer] of Obstetricians and Gynecologists (ACOG) Practice Bulletin 162, all pregnant women should be offered prenatal assessment for aneuploidy by diagnostic testing regardless of maternal age or other risk factors. If indicated, genetic testing that could be ordered on a CVS or amniocentesis sample includes a fetal karyotype, fetal microarray, and testing for specific syndromes.  Carrier screening. Per the ACOG Committee  Opinion 691, all women who are considering a pregnancy or are currently pregnant should be offered carrier  screening for, at minimum, Cystic Fibrosis (CF), and Spinal Muscular Atrophy (SMA). The mode of inheritance, clinical manifestations of these conditions, as well as details about testing were reviewed. A negative result on carrier screening reduces the likelihood of being a carrier, however, does not entirely rule out the possibility. If Jaycey was found to be a carrier for a specific condition, carrier screening for their reproductive partner would be recommended.    Patient Plan:  Proceed with: Routine prenatal care Informed consent was obtained. All questions were answered.  Declined: CVS, Amniocentesis, Carrier Screening   Thank you for sharing in the care of Anureet with [place].  Please do not hesitate to contact us if you have any questions.  Teena Dunk, MS, [facility]

## 2021-06-01 ENCOUNTER — Ambulatory Visit: Payer: BC Managed Care – PPO

## 2021-09-21 LAB — OB RESULTS CONSOLE GBS: GBS: NEGATIVE

## 2021-10-18 ENCOUNTER — Telehealth (HOSPITAL_COMMUNITY): Payer: Self-pay | Admitting: *Deleted

## 2021-10-18 NOTE — Telephone Encounter (Signed)
PLAN:

## 2021-10-19 ENCOUNTER — Other Ambulatory Visit: Payer: Self-pay | Admitting: Obstetrics

## 2021-10-19 ENCOUNTER — Telehealth (HOSPITAL_COMMUNITY): Payer: Self-pay | Admitting: *Deleted

## 2021-10-19 NOTE — Telephone Encounter (Signed)
Preadmission screen  

## 2021-10-22 ENCOUNTER — Telehealth (HOSPITAL_COMMUNITY): Payer: Self-pay | Admitting: *Deleted

## 2021-10-22 NOTE — Telephone Encounter (Signed)
Preadmission screen  

## 2021-10-23 ENCOUNTER — Telehealth (HOSPITAL_COMMUNITY): Payer: Self-pay | Admitting: *Deleted

## 2021-10-23 ENCOUNTER — Encounter (HOSPITAL_COMMUNITY): Payer: Self-pay | Admitting: *Deleted

## 2021-10-23 NOTE — Telephone Encounter (Signed)
Preadmission screen  

## 2021-10-24 ENCOUNTER — Other Ambulatory Visit: Payer: Self-pay | Admitting: Obstetrics

## 2021-10-24 ENCOUNTER — Encounter (HOSPITAL_COMMUNITY): Payer: Self-pay | Admitting: Obstetrics

## 2021-10-24 NOTE — Patient Instructions (Signed)
Engineer, [name] ? 10/24/2021 ? ? Your procedure is scheduled on:  10/25/2021 ? Arrive at 1200 at [employer] on [address] at [facility]  and [employer]. You are invited to use the FREE valet parking or use the Visitor's parking deck. ? Pick up the phone at the desk and dial [phone]. ? Call this number if you have problems the morning of surgery: [phone] ? Remember: ? ? Do not eat food:(After Midnight) Desp?s de medianoche. ? Do not drink clear liquids: (6 Hours before arrival) 6 horas ante llegada. ? Take these medicines the morning of surgery with A SIP OF WATER:  Take singulair as prescribed ? ? Do not wear jewelry, make-up or nail polish. ? Do not wear lotions, powders, or perfumes. Do not wear deodorant. ? Do not shave 48 hours prior to surgery. ? Do not bring valuables to the hospital.  [facility] is not  ? responsible for any belongings or valuables brought to the hospital. ? Contacts, dentures or bridgework may not be worn into surgery. ? Leave suitcase in the car. After surgery it may be brought to your room. ? For patients admitted to the hospital, checkout time is 11:00 AM the day of  ?            discharge. ? ?   ? Please read over the following fact sheets that you were given:  ?   Preparing for Surgery ? ? ?

## 2021-10-25 ENCOUNTER — Inpatient Hospital Stay (HOSPITAL_COMMUNITY): Payer: BC Managed Care – PPO | Admitting: Anesthesiology

## 2021-10-25 ENCOUNTER — Encounter (HOSPITAL_COMMUNITY): Payer: Self-pay | Admitting: Obstetrics

## 2021-10-25 ENCOUNTER — Inpatient Hospital Stay (HOSPITAL_COMMUNITY)
Admission: AD | Admit: 2021-10-25 | Discharge: 2021-10-27 | DRG: 788 | Disposition: A | Discharge status: Home / Self Care | Payer: BC Managed Care – PPO | Attending: Obstetrics | Admitting: Obstetrics

## 2021-10-25 ENCOUNTER — Other Ambulatory Visit: Payer: Self-pay

## 2021-10-25 ENCOUNTER — Encounter (HOSPITAL_COMMUNITY)
Admission: AD | Disposition: A | Discharge status: Home / Self Care | Payer: Self-pay | Source: Home / Self Care | Attending: Obstetrics

## 2021-10-25 ENCOUNTER — Inpatient Hospital Stay (HOSPITAL_COMMUNITY): Payer: BC Managed Care – PPO

## 2021-10-25 DIAGNOSIS — O480 Post-term pregnancy: Secondary | ICD-10-CM | POA: Diagnosis present

## 2021-10-25 DIAGNOSIS — Z3A40 40 weeks gestation of pregnancy: Secondary | ICD-10-CM

## 2021-10-25 DIAGNOSIS — L910 Hypertrophic scar: Secondary | ICD-10-CM | POA: Diagnosis present

## 2021-10-25 DIAGNOSIS — O9972 Diseases of the skin and subcutaneous tissue complicating childbirth: Secondary | ICD-10-CM | POA: Diagnosis present

## 2021-10-25 DIAGNOSIS — O34211 Maternal care for low transverse scar from previous cesarean delivery: Secondary | ICD-10-CM | POA: Diagnosis present

## 2021-10-25 DIAGNOSIS — O3663X0 Maternal care for excessive fetal growth, third trimester, not applicable or unspecified: Secondary | ICD-10-CM | POA: Diagnosis present

## 2021-10-25 DIAGNOSIS — Z9889 Other specified postprocedural states: Secondary | ICD-10-CM

## 2021-10-25 DIAGNOSIS — O403XX0 Polyhydramnios, third trimester, not applicable or unspecified: Secondary | ICD-10-CM | POA: Diagnosis present

## 2021-10-25 DIAGNOSIS — Z872 Personal history of diseases of the skin and subcutaneous tissue: Secondary | ICD-10-CM

## 2021-10-25 DIAGNOSIS — Z98891 History of uterine scar from previous surgery: Secondary | ICD-10-CM

## 2021-10-25 LAB — CBC
HCT: 39.6 % (ref 36.0–46.0)
Hemoglobin: 13.4 g/dL (ref 12.0–15.0)
MCH: 33.2 pg (ref 26.0–34.0)
MCHC: 33.8 g/dL (ref 30.0–36.0)
MCV: 98 fL (ref 80.0–100.0)
Platelets: 146 10*3/uL — ABNORMAL LOW (ref 150–400)
RBC: 4.04 MIL/uL (ref 3.87–5.11)
RDW: 12.3 % (ref 11.5–15.5)
WBC: 7 10*3/uL (ref 4.0–10.5)
nRBC: 0 % (ref 0.0–0.2)

## 2021-10-25 LAB — TYPE AND SCREEN
ABO/RH(D): A POS
Antibody Screen: NEGATIVE

## 2021-10-25 SURGERY — Surgical Case
Anesthesia: Spinal | Wound class: Clean Contaminated

## 2021-10-25 MED ORDER — ZOLPIDEM TARTRATE 5 MG PO TABS: 5.0000 mg | ORAL_TABLET | Freq: Every evening | ORAL | Status: DC | PRN

## 2021-10-25 MED ORDER — ACETAMINOPHEN 500 MG PO TABS: 1000.0000 mg | ORAL_TABLET | Freq: Four times a day (QID) | ORAL | Status: DC

## 2021-10-25 MED ORDER — NALOXONE HCL 0.4 MG/ML IJ SOLN: 0.4000 mg | INTRAMUSCULAR | Status: DC | PRN

## 2021-10-25 MED ORDER — ACETAMINOPHEN 500 MG PO TABS: 1000.0000 mg | ORAL_TABLET | Freq: Once | ORAL | Status: DC

## 2021-10-25 MED ORDER — ACETAMINOPHEN 10 MG/ML IV SOLN
INTRAVENOUS | Status: DC | PRN
  Administered 2021-10-25: 1000 mg via INTRAVENOUS

## 2021-10-25 MED ORDER — ONDANSETRON HCL 4 MG/2ML IJ SOLN
INTRAMUSCULAR | Status: AC
  Filled 2021-10-25: qty 2

## 2021-10-25 MED ORDER — ONDANSETRON HCL 4 MG/2ML IJ SOLN: 4.0000 mg | Freq: Three times a day (TID) | INTRAMUSCULAR | Status: DC | PRN

## 2021-10-25 MED ORDER — FENTANYL CITRATE (PF) 100 MCG/2ML IJ SOLN
INTRAMUSCULAR | Status: AC
  Filled 2021-10-25: qty 2

## 2021-10-25 MED ORDER — PHENYLEPHRINE HCL-NACL 20-0.9 MG/250ML-% IV SOLN
INTRAVENOUS | Status: AC
  Filled 2021-10-25: qty 250

## 2021-10-25 MED ORDER — DIPHENHYDRAMINE HCL 50 MG/ML IJ SOLN
12.5000 mg | INTRAMUSCULAR | Status: DC | PRN
Start: 2021-10-25 — End: 2021-10-27

## 2021-10-25 MED ORDER — KETOROLAC TROMETHAMINE 30 MG/ML IJ SOLN: 30.0000 mg | Freq: Four times a day (QID) | INTRAMUSCULAR | Status: AC | PRN

## 2021-10-25 MED ORDER — DIPHENHYDRAMINE HCL 25 MG PO CAPS: 25.0000 mg | ORAL_CAPSULE | ORAL | Status: DC | PRN

## 2021-10-25 MED ORDER — KETOROLAC TROMETHAMINE 30 MG/ML IJ SOLN
INTRAMUSCULAR | Status: AC
Start: 2021-10-25 — End: ?
  Filled 2021-10-25: qty 1

## 2021-10-25 MED ORDER — PHENYLEPHRINE HCL-NACL 20-0.9 MG/250ML-% IV SOLN
INTRAVENOUS | Status: DC | PRN
  Administered 2021-10-25: 60 ug/min via INTRAVENOUS

## 2021-10-25 MED ORDER — BUPIVACAINE HCL 0.25 % IJ SOLN
INTRAMUSCULAR | Status: DC | PRN
Start: 2021-10-25 — End: 2021-10-25
  Administered 2021-10-25: 30 mL

## 2021-10-25 MED ORDER — TRIAMCINOLONE ACETONIDE 40 MG/ML IJ SUSP
INTRAMUSCULAR | Status: AC
  Filled 2021-10-25: qty 1

## 2021-10-25 MED ORDER — DROPERIDOL 2.5 MG/ML IJ SOLN: 0.6250 mg | Freq: Once | INTRAMUSCULAR | Status: DC | PRN

## 2021-10-25 MED ORDER — MORPHINE SULFATE (PF) 0.5 MG/ML IJ SOLN
INTRAMUSCULAR | Status: DC | PRN
  Administered 2021-10-25: .15 mg via INTRATHECAL

## 2021-10-25 MED ORDER — MORPHINE SULFATE (PF) 0.5 MG/ML IJ SOLN
INTRAMUSCULAR | Status: AC
  Filled 2021-10-25: qty 10

## 2021-10-25 MED ORDER — BUPIVACAINE HCL (PF) 0.25 % IJ SOLN
INTRAMUSCULAR | Status: AC
Start: 2021-10-25 — End: ?
  Filled 2021-10-25: qty 30

## 2021-10-25 MED ORDER — CEFAZOLIN SODIUM-DEXTROSE 2-4 GM/100ML-% IV SOLN: 2.0000 g | INTRAVENOUS | Status: DC

## 2021-10-25 MED ORDER — ACETAMINOPHEN 500 MG PO TABS
1000.0000 mg | ORAL_TABLET | Freq: Four times a day (QID) | ORAL | Status: DC
  Administered 2021-10-26 – 2021-10-27 (×6): 1000 mg via ORAL
  Filled 2021-10-25 (×7): qty 2

## 2021-10-25 MED ORDER — TRIAMCINOLONE ACETONIDE 40 MG/ML IJ SUSP
INTRAMUSCULAR | Status: DC | PRN
  Administered 2021-10-25: 40 mg via INTRAMUSCULAR

## 2021-10-25 MED ORDER — SIMETHICONE 80 MG PO CHEW: 80.0000 mg | CHEWABLE_TABLET | ORAL | Status: DC | PRN

## 2021-10-25 MED ORDER — LACTATED RINGERS IV SOLN: INTRAVENOUS | Status: DC | PRN

## 2021-10-25 MED ORDER — BUPIVACAINE HCL (PF) 0.25 % IJ SOLN
30.0000 mL | Freq: Once | INTRAMUSCULAR | Status: DC
  Filled 2021-10-25: qty 30

## 2021-10-25 MED ORDER — DIBUCAINE (PERIANAL) 1 % EX OINT: 1.0000 "application " | TOPICAL_OINTMENT | CUTANEOUS | Status: DC | PRN

## 2021-10-25 MED ORDER — OXYTOCIN-SODIUM CHLORIDE 30-0.9 UT/500ML-% IV SOLN
INTRAVENOUS | Status: AC
Start: 2021-10-25 — End: ?
  Filled 2021-10-25: qty 500

## 2021-10-25 MED ORDER — SODIUM CHLORIDE 0.9% FLUSH: 3.0000 mL | INTRAVENOUS | Status: DC | PRN

## 2021-10-25 MED ORDER — PHENYLEPHRINE 80 MCG/ML (10ML) SYRINGE FOR IV PUSH (FOR BLOOD PRESSURE SUPPORT)
PREFILLED_SYRINGE | INTRAVENOUS | Status: AC
  Filled 2021-10-25: qty 10

## 2021-10-25 MED ORDER — WITCH HAZEL-GLYCERIN EX PADS: 1.0000 "application " | MEDICATED_PAD | CUTANEOUS | Status: DC | PRN

## 2021-10-25 MED ORDER — PRENATAL MULTIVITAMIN CH
1.0000 | ORAL_TABLET | Freq: Every day | ORAL | Status: DC
  Filled 2021-10-25: qty 1

## 2021-10-25 MED ORDER — SENNOSIDES-DOCUSATE SODIUM 8.6-50 MG PO TABS
2.0000 | ORAL_TABLET | ORAL | Status: DC
  Administered 2021-10-26 – 2021-10-27 (×2): 2 via ORAL
  Filled 2021-10-25 (×2): qty 2

## 2021-10-25 MED ORDER — NALBUPHINE HCL 10 MG/ML IJ SOLN
INTRAMUSCULAR | Status: AC
  Filled 2021-10-25: qty 1

## 2021-10-25 MED ORDER — BUPIVACAINE IN DEXTROSE 0.75-8.25 % IT SOLN
INTRATHECAL | Status: DC | PRN
  Administered 2021-10-25: 1.6 mL via INTRATHECAL

## 2021-10-25 MED ORDER — TRIAMCINOLONE ACETONIDE 40 MG/ML IJ SUSP: 40.0000 mg | Freq: Once | INTRAMUSCULAR | Status: DC

## 2021-10-25 MED ORDER — FENTANYL CITRATE (PF) 100 MCG/2ML IJ SOLN: 25.0000 ug | INTRAMUSCULAR | Status: DC | PRN

## 2021-10-25 MED ORDER — KETOROLAC TROMETHAMINE 30 MG/ML IJ SOLN
30.0000 mg | Freq: Four times a day (QID) | INTRAMUSCULAR | Status: AC
  Administered 2021-10-26 (×3): 30 mg via INTRAVENOUS
  Filled 2021-10-25 (×3): qty 1

## 2021-10-25 MED ORDER — SIMETHICONE 80 MG PO CHEW
80.0000 mg | CHEWABLE_TABLET | Freq: Three times a day (TID) | ORAL | Status: DC
  Administered 2021-10-26 – 2021-10-27 (×3): 80 mg via ORAL
  Filled 2021-10-25 (×3): qty 1

## 2021-10-25 MED ORDER — PHENYLEPHRINE HCL (PRESSORS) 10 MG/ML IV SOLN
INTRAVENOUS | Status: DC | PRN
  Administered 2021-10-25: 160 ug via INTRAVENOUS

## 2021-10-25 MED ORDER — OXYTOCIN-SODIUM CHLORIDE 30-0.9 UT/500ML-% IV SOLN: 2.5000 [IU]/h | INTRAVENOUS | Status: AC

## 2021-10-25 MED ORDER — POVIDONE-IODINE 10 % EX SWAB
2.0000 "application " | Freq: Once | CUTANEOUS | Status: AC
  Administered 2021-10-25: 2 via TOPICAL

## 2021-10-25 MED ORDER — OXYCODONE HCL 5 MG PO TABS: 5.0000 mg | ORAL_TABLET | ORAL | Status: DC | PRN

## 2021-10-25 MED ORDER — LACTATED RINGERS IV SOLN: INTRAVENOUS | Status: DC

## 2021-10-25 MED ORDER — CEFAZOLIN SODIUM-DEXTROSE 2-4 GM/100ML-% IV SOLN
INTRAVENOUS | Status: AC
Start: 2021-10-25 — End: ?
  Filled 2021-10-25: qty 100

## 2021-10-25 MED ORDER — OXYTOCIN-SODIUM CHLORIDE 30-0.9 UT/500ML-% IV SOLN
INTRAVENOUS | Status: DC | PRN
  Administered 2021-10-25: 200 mL via INTRAVENOUS

## 2021-10-25 MED ORDER — ACETAMINOPHEN 10 MG/ML IV SOLN
INTRAVENOUS | Status: AC
Start: 2021-10-25 — End: ?
  Filled 2021-10-25: qty 100

## 2021-10-25 MED ORDER — ONDANSETRON HCL 4 MG/2ML IJ SOLN
INTRAMUSCULAR | Status: DC | PRN
  Administered 2021-10-25: 4 mg via INTRAVENOUS

## 2021-10-25 MED ORDER — TETANUS-DIPHTH-ACELL PERTUSSIS 5-2.5-18.5 LF-MCG/0.5 IM SUSY: 0.5000 mL | PREFILLED_SYRINGE | Freq: Once | INTRAMUSCULAR | Status: DC

## 2021-10-25 MED ORDER — IBUPROFEN 600 MG PO TABS
600.0000 mg | ORAL_TABLET | Freq: Four times a day (QID) | ORAL | Status: DC
  Administered 2021-10-26 – 2021-10-27 (×3): 600 mg via ORAL
  Filled 2021-10-25 (×3): qty 1

## 2021-10-25 MED ORDER — DEXAMETHASONE SODIUM PHOSPHATE 10 MG/ML IJ SOLN
INTRAMUSCULAR | Status: AC
  Filled 2021-10-25: qty 1

## 2021-10-25 MED ORDER — CEFAZOLIN SODIUM-DEXTROSE 2-3 GM-%(50ML) IV SOLR
INTRAVENOUS | Status: DC | PRN
  Administered 2021-10-25: 2 g via INTRAVENOUS

## 2021-10-25 MED ORDER — DIPHENHYDRAMINE HCL 25 MG PO CAPS: 25.0000 mg | ORAL_CAPSULE | Freq: Four times a day (QID) | ORAL | Status: DC | PRN

## 2021-10-25 MED ORDER — COCONUT OIL OIL: 1.0000 "application " | TOPICAL_OIL | Status: DC | PRN

## 2021-10-25 MED ORDER — NALOXONE HCL 4 MG/10ML IJ SOLN
1.0000 ug/kg/h | INTRAVENOUS | Status: DC | PRN
  Filled 2021-10-25: qty 5

## 2021-10-25 MED ORDER — FENTANYL CITRATE (PF) 100 MCG/2ML IJ SOLN
INTRAMUSCULAR | Status: DC | PRN
  Administered 2021-10-25: 15 ug via INTRATHECAL

## 2021-10-25 MED ORDER — MENTHOL 3 MG MT LOZG: 1.0000 | LOZENGE | OROMUCOSAL | Status: DC | PRN

## 2021-10-25 MED ORDER — ACETAMINOPHEN 160 MG/5ML PO SOLN: 1000.0000 mg | Freq: Once | ORAL | Status: DC

## 2021-10-25 MED ORDER — NALBUPHINE HCL 10 MG/ML IJ SOLN
5.0000 mg | Freq: Once | INTRAMUSCULAR | Status: AC
  Administered 2021-10-25: 5 mg via INTRAVENOUS

## 2021-10-25 MED ORDER — DEXAMETHASONE SODIUM PHOSPHATE 4 MG/ML IJ SOLN
INTRAMUSCULAR | Status: DC | PRN
  Administered 2021-10-25: 4 mg via INTRAVENOUS

## 2021-10-25 MED ORDER — KETOROLAC TROMETHAMINE 30 MG/ML IJ SOLN
30.0000 mg | Freq: Once | INTRAMUSCULAR | Status: AC
  Administered 2021-10-25: 30 mg via INTRAVENOUS

## 2021-10-25 SURGICAL SUPPLY — 32 items
BENZOIN TINCTURE PRP APPL 2/3 (GAUZE/BANDAGES/DRESSINGS) ×1 IMPLANT
CHLORAPREP W/TINT 26ML (MISCELLANEOUS) ×4 IMPLANT
CLAMP CORD UMBIL (MISCELLANEOUS) ×2 IMPLANT
CLOTH BEACON ORANGE TIMEOUT ST (SAFETY) ×2 IMPLANT
DRSG OPSITE POSTOP 4X10 (GAUZE/BANDAGES/DRESSINGS) ×2 IMPLANT
ELECT REM PT RETURN 9FT ADLT (ELECTROSURGICAL) ×2
ELECTRODE REM PT RTRN 9FT ADLT (ELECTROSURGICAL) ×1 IMPLANT
EXTRACTOR VACUUM M CUP 4 TUBE (SUCTIONS) IMPLANT
GLOVE BIO SURGEON STRL SZ 6.5 (GLOVE) ×2 IMPLANT
GLOVE BIOGEL PI IND STRL 7.0 (GLOVE) ×3 IMPLANT
GLOVE BIOGEL PI INDICATOR 7.0 (GLOVE) ×3
GOWN STRL REUS W/TWL LRG LVL3 (GOWN DISPOSABLE) ×4 IMPLANT
KIT ABG SYR 3ML LUER SLIP (SYRINGE) IMPLANT
NDL HYPO 25X5/8 SAFETYGLIDE (NEEDLE) IMPLANT
NEEDLE HYPO 22GX1.5 SAFETY (NEEDLE) IMPLANT
NEEDLE HYPO 25X5/8 SAFETYGLIDE (NEEDLE) IMPLANT
NS IRRIG 1000ML POUR BTL (IV SOLUTION) ×2 IMPLANT
PACK C SECTION WH (CUSTOM PROCEDURE TRAY) ×2 IMPLANT
PAD OB MATERNITY 4.3X12.25 (PERSONAL CARE ITEMS) ×2 IMPLANT
STRIP CLOSURE SKIN 1/2X4 (GAUZE/BANDAGES/DRESSINGS) ×1 IMPLANT
SUT MON AB 4-0 PS1 27 (SUTURE) ×2 IMPLANT
SUT PLAIN 0 NONE (SUTURE) IMPLANT
SUT PLAIN 2 0 XLH (SUTURE) IMPLANT
SUT VIC AB 0 CT1 36 (SUTURE) ×4 IMPLANT
SUT VIC AB 0 CTX 36 (SUTURE) ×2
SUT VIC AB 0 CTX36XBRD ANBCTRL (SUTURE) ×2 IMPLANT
SUT VIC AB 2-0 CT1 27 (SUTURE) ×1
SUT VIC AB 2-0 CT1 TAPERPNT 27 (SUTURE) ×1 IMPLANT
SYR CONTROL 10ML LL (SYRINGE) IMPLANT
TOWEL OR 17X24 6PK STRL BLUE (TOWEL DISPOSABLE) ×2 IMPLANT
TRAY FOLEY W/BAG SLVR 14FR LF (SET/KITS/TRAYS/PACK) IMPLANT
WATER STERILE IRR 1000ML POUR (IV SOLUTION) ×2 IMPLANT

## 2021-10-25 NOTE — Anesthesia Postprocedure Evaluation (Signed)
Anesthesia Post Note ? ?Patient: Engineer, [name] ? ?Procedure(s) Performed: Repeat CESAREAN SECTION ? ?  ? ?Patient location during evaluation: PACU ?Anesthesia Type: Spinal ?Level of consciousness: awake and alert ?Pain management: pain level controlled ?Vital Signs Assessment: post-procedure vital signs reviewed and stable ?Respiratory status: spontaneous breathing, nonlabored ventilation and respiratory function stable ?Cardiovascular status: blood pressure returned to baseline ?Postop Assessment: no apparent nausea or vomiting, spinal receding, no headache and no backache ?Anesthetic complications: no ? ? ?No notable events documented. ? ?Last Vitals:  ?Vitals:  ? 10/25/21 1645 10/25/21 1700  ?BP: 98/65   ?Pulse: 80 84  ?Resp: 16 (!) 21  ?Temp:  36.7 ?C  ?SpO2: 99% 98%  ?  ?Last Pain:  ?Vitals:  ? 10/25/21 1600  ?TempSrc: Oral  ?PainSc: 0-No pain  ? ?Pain Goal:   ? ?LLE Motor Response: Purposeful movement (10/25/21 1615) ?LLE Sensation: Tingling (10/25/21 1615) ?RLE Motor Response: Purposeful movement (10/25/21 1615) ?RLE Sensation: Tingling (10/25/21 1615) ?L Sensory Level: L3-Anterior knee, lower leg (10/25/21 1615) ?R Sensory Level: L3-Anterior knee, lower leg (10/25/21 1615) ?Epidural/Spinal Function Cutaneous sensation: Able to Wiggle Toes (10/25/21 1615), Patient able to flex knees: Yes (10/25/21 1615), Patient able to lift hips off bed: No (10/25/21 1615), Back pain beyond tenderness at insertion site: No (10/25/21 1615), Progressively worsening motor and/or sensory loss: No (10/25/21 1615) ? ?Melanie Munoz ? ? ? ? ?

## 2021-10-25 NOTE — Op Note (Signed)
10/25/2021 ? ?3:59 PM ? 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 ?PRE-OPERATIVE DIAGNOSIS:  Macrosomia, Polyhydramnios, Previous Cesarean Section; keloid resection ? ?POST-OPERATIVE DIAGNOSIS:  Macrosomia, Polyhydramnios, Previous Cesarean Section; keloid resection ? ?PROCEDURE:  Procedure(s) with comments: ?Repeat CESAREAN SECTION (N/A) - with keloid resection ?Low-transverse cesarean section with 2 layer closure ? ?SURGEON:  Surgeon(s) and Role: ?   Aloha Gell, MD - Primary ? ?PHYSICIAN ASSISTANT:  ? ?ASSISTANTS: Derrell Lolling, CNM ? ?ANESTHESIA:   local and spinal ? ?EBL:  250 mL  ? ?BLOOD ADMINISTERED:none ? ?DRAINS: Urinary Catheter (Foley)  ? ?LOCAL MEDICATIONS USED:  MARCAINE    and Amount: 30 cc quarter percent Marcaine mixed with 40 mg per 1 mL Kenalog ml ? ?SPECIMEN: Keloid scar and placenta to labor and delivery for disposal ? ?DISPOSITION OF SPECIMEN:  As above ? ?COUNTS:  YES ? ?TOURNIQUET:  * No tourniquets in log * ? ?DICTATION: .Note written in EPIC ? ?PLAN OF CARE: Admit to inpatient  ? ?PATIENT DISPOSITION:  PACU - hemodynamically stable. ?  ?Delay start of Pharmacological VTE agent (>24hrs) due to surgical blood loss or risk of bleeding: yes ? ? ? ?Findings:  @BABYSEXEBC @ infant,  APGAR (1 MIN): 7   ?APGAR (5 MINS): 9   ?APGAR (10 MINS):   ?Normal uterus, tubes and ovaries, normal placenta. 3VC, clear amniotic fluid ? ?EBL: Per nursing notes cc ?Antibiotics:   2g Ancef ?Complications: none ? ?Indications: This is a 36 y.o. year-old, G3, P2 at [redacted]w[redacted]d admitted for repeat cesarean section due to prior cesarean section, macrosomia and polyhydramnios. Risks benefits and alternatives of the procedure were discussed with the patient who agreed to proceed ? ?Procedure:  After informed consent was obtained the patient was taken to the operating room where spinal anesthesia was initiated.  She was prepped and draped in the normal sterile fashion in dorsal supine position with a leftward tilt.  A foley  catheter was in place.  A solution of 30 cc quarter percent Marcaine mixed with 40 units of Kenalog was injected at her prior Pfannenstiel incision site into the second keloid scar.  An elliptical incision was made with a scalpel around the keloid scar and the subcutaneous tissue was undermined and dissected off to remove the keloid scar. Dissection was carried down with the Bovie cautery until the fascia was reached.  Of note this subcutaneous layer with Scarpa's layer and the fascia was quite thick and great care was done to avoid unintended peritoneal entry.  As we got closer to the thickened and poorly defined fascial tissue a scalpel was used to dissect through the fascial layer.  Mayo scissors were then used to extend the fascial incision laterally.  The peritoneum was adhesed quite densely to the fascia.  The peritoneum was entered during the fascial dissection.  In fact the peritoneum was transected horizontally off of the fascia during the fascial dissection.  The superior aspect of the fascial incision was grasped with the Coker clamps, elevated up and the peritoneum was dissected off the fascia.  No rectus muscles were encountered. The peritoneal incision was found to be adequate. the bladder blade was inserted and palpation was done to assess the fetal position and the location of the uterine vessels. The lower segment of the uterus was incised sharply with the scalpel and extended  bluntly in the cephalo-caudal fashion.  The bandage scissors were used in the left angle of the incision to extend it further.  The infant  was grasped, brought to the incision,  rotated, nuchal cord x2 was reduced and the infant was delivered with fundal pressure. The cord was clamped and cut after 1 minute delay. The infant was handed off to the waiting pediatrician. The placenta was expressed. The uterus was left in situ the uterus was cleared of all clots and debris. The uterine incision was repaired with 0 Vicryl in a  running locked fashion.  A second layer of the same suture was used in an imbricating fashion to obtain excellent hemostasis.  The tubes and ovaries were evaluated and felt to be normal Tthe gutters were cleared of all clots and debris. The uterine incision was reinspected and found to be hemostatic. The peritoneum was evaluated given the nature of the horizontal peritoneal incision.  The peritoneum was then closed horizontally with 2-0 Vicryl in a running fashion. The underside of the fascia were inspected and found to be hemostatic. The fascia was closed with 0 Vicryl in a single layer. The subcutaneous tissue was irrigated. Scarpa's layer was closed with a 2-0 plain gut suture. The skin was closed with a 4-0 Monocryl in a single layer. The patient tolerated the procedure well. Sponge lap and needle counts were correct x3 and patient was taken to the recovery room in a stable condition. ? ?Ala Dach ?10/25/2021 4:01 PM  ? ?

## 2021-10-25 NOTE — Transfer of Care (Signed)
Immediate Anesthesia Transfer of Care Note ? ?Patient: Engineer, [name] ? ?Procedure(s) Performed: Repeat CESAREAN SECTION ? ?Patient Location: PACU ? ?Anesthesia Type:Spinal ? ?Level of Consciousness: awake, alert  and oriented ? ?Airway & Oxygen Therapy: Patient Spontanous Breathing ? ?Post-op Assessment: Report given to RN and Post -op Vital signs reviewed and stable ? ?Post vital signs: Reviewed and stable BP 98/55 ? ?Last Vitals:  ?Vitals Value Taken Time  ?BP    ?Temp    ?Pulse 83 10/25/21 1603  ?Resp 23 10/25/21 1603  ?SpO2 99 % 10/25/21 1603  ?Vitals shown include unvalidated device data. ? ?Last Pain:  ?Vitals:  ? 10/25/21 1309  ?TempSrc: Oral  ?   ? ?  ? ?Complications: No notable events documented. ?

## 2021-10-25 NOTE — Anesthesia Procedure Notes (Signed)
Spinal ? ?Patient location during procedure: OR ?Start time: 10/25/2021 2:30 PM ?End time: 10/25/2021 2:33 PM ?Reason for block: surgical anesthesia ?Staffing ?Performed: anesthesiologist  ?Anesthesiologist: Brennan Bailey, MD ?Preanesthetic Checklist ?Completed: patient identified, IV checked, risks and benefits discussed, monitors and equipment checked, pre-op evaluation and timeout performed ?Spinal Block ?Patient position: sitting ?Prep: DuraPrep and site prepped and draped ?Patient monitoring: heart rate, continuous pulse ox and blood pressure ?Approach: midline ?Location: L3-4 ?Injection technique: single-shot ?Needle ?Needle type: Pencan  ?Needle gauge: 24 G ?Needle length: 10 cm ?Assessment ?Sensory level: T4 ?Events: CSF return ?Additional Notes ?Risks, benefits, and alternative discussed. Patient gave consent to procedure. Prepped and draped in sitting position. Clear CSF obtained after one needle pass. Positive terminal aspiration. No pain or paraesthesias with injection. Patient tolerated procedure well. Vital signs stable. Tawny Asal, MD ? ? ? ? ?

## 2021-10-25 NOTE — H&P (Signed)
Melanie Munoz is a 36 y.o. [id number] at 53w4dpresenting for repeat cesarean section. Pt notes intermittent contractions . Good fetal movement, No vaginal bleeding, not leaking fluid. ? ?[facility] ?-Advanced maternal age.  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  Female fetus and anatomy scan.  Status post consultation with MFM and genetic counselor.  Declined amniocentesis ?-Rubella nonimmune.  Plan MMR postpartum ?-Macrosomia with polyhydramnios.  Ultrasound 2 days ago baby 474 8 g, 10 pounds 7 ounces, greater than the 99th percentile.  AFI 23, 99th percentile.  Given new macrosomia and polyhydramnios patient was counseled against trial of labor after cesarean section.  Initial plans were for [place]. ?-Keloid.  Patient with significant keloid history and plan keloid resection. ?-Obstetric history.  G1 primary cesarean section for bleeding previa at 35 weeks.  G2 successful vaginal birth after cesarean section, 8 pounds 7 ounces ? ? ?Prenatal Transfer Tool  ?Maternal Diabetes: No ?Genetic Screening: Abnormal:  Results: Other: ?Maternal Ultrasounds/Referrals: Normal ?Fetal Ultrasounds or other Referrals:  [facility]  ?Maternal Substance Abuse:  No ?Significant Maternal Medications:  None ?Significant Maternal Lab Results: Group B Strep negative ? ? ? ? ?OB History   ? ? Gravida  ?3  ? Para  ?2  ? Term  ?1  ? Preterm  ?1  ? AB  ?   ? Living  ?2  ?  ? ? SAB  ?   ? IAB  ?   ? Ectopic  ?   ? Multiple  ?0  ? Live Births  ?2  ?   ?  ?  ? ?Past Medical History:  ?Diagnosis Date  ? Medical history non-contributory   ? ?Past Surgical History:  ?Procedure Laterality Date  ? APPENDECTOMY    ? CESAREAN SECTION N/A 12/03/2016  ? Procedure: CESAREAN SECTION;  Surgeon: FAloha Gell MD;  [facility]  Service: Obstetrics;  Laterality: N/A;  ? CESAREAN SECTION    ? ?Family History: family history includes Cancer in her father and maternal  grandmother; Diabetes in her maternal grandfather. ?Social History:  reports that she has never smoked. She has never used smokeless tobacco. She reports that she does not drink alcohol and does not use drugs. ? ?Review of Systems - Negative except discomfort of pregnancy ? ? ?  ?Blood pressure 113/73, pulse 80, temperature 98.1 ?F (36.7 ?C), temperature source Oral, resp. rate 20, height '5\' 7"'  (1.702 m), weight 95.7 kg, last menstrual period 01/14/2021, SpO2 100 %, currently breastfeeding. ? ?Physical Exam:  ?Gen: well appearing, no distress ? ?Back: no CVAT ?Abd: gravid, NT, no RUQ pain ?LE: Trace edema, equal bilaterally, non-tender ?Skin: Large keloid at site of Pfannenstiel ? 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 ? ?Assessment/Plan: 36y.o. [id number] 458w4dPrior cesarean section.  Plan repeat cesarean section.  Macrosomia and polyhydramnios noted and causing change from plan for trial of labor to repeat cesarean section ?MMR postpartum ?Kenalog injection for keloid prevention  ? ?KeAla Dach5/09/2021, 2:22 PM ? ? ?

## 2021-10-25 NOTE — Brief Op Note (Signed)
10/25/2021 ? ?3:59 PM ? 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 ?PRE-OPERATIVE DIAGNOSIS:  Macrosomia, Polyhydramnios, Previous Cesarean Section; keloid resection ? ?POST-OPERATIVE DIAGNOSIS:  Macrosomia, Polyhydramnios, Previous Cesarean Section; keloid resection ? ?PROCEDURE:  Procedure(s) with comments: ?Repeat CESAREAN SECTION (N/A) - with keloid resection ?Low-transverse cesarean section with 2 layer closure ? ?SURGEON:  Surgeon(s) and Role: ?   Noland Fordyce, MD - Primary ? ?PHYSICIAN ASSISTANT:  ? ?ASSISTANTS: Arlan Organ, CNM ? ?ANESTHESIA:   local and spinal ? ?EBL:  250 mL  ? ?BLOOD ADMINISTERED:none ? ?DRAINS: Urinary Catheter (Foley)  ? ?LOCAL MEDICATIONS USED:  MARCAINE    and Amount: 30 cc quarter percent Marcaine mixed with 40 mg per 1 mL Kenalog ml ? ?SPECIMEN: Keloid scar and placenta to labor and delivery for disposal ? ?DISPOSITION OF SPECIMEN:   As above ? ?COUNTS:  YES ? ?TOURNIQUET:  * No tourniquets in log * ? ?DICTATION: .Note written in EPIC ? ?PLAN OF CARE: Admit to inpatient  ? ?PATIENT DISPOSITION:  PACU - hemodynamically stable. ?  ?Delay start of Pharmacological VTE agent (>24hrs) due to surgical blood loss or risk of bleeding: yes ? ?

## 2021-10-25 NOTE — Anesthesia Preprocedure Evaluation (Addendum)
Anesthesia Evaluation  ?Patient identified by MRN, date of birth, ID band ?Patient awake ? ? ? ?Reviewed: ?Allergy & Precautions, NPO status , Patient's Chart, lab work & pertinent test results ? ?History of Anesthesia Complications ?Negative for: history of anesthetic complications ? ?Airway ?Mallampati: II ? ?TM Distance: >3 FB ?Neck ROM: Full ? ? ? Dental ?no notable dental hx. ? ?  ?Pulmonary ?neg pulmonary ROS,  ?  ?Pulmonary exam normal ? ? ? ? ? ? ? Cardiovascular ?negative cardio ROS ?Normal cardiovascular exam ? ? ?  ?Neuro/Psych ?negative neurological ROS ? negative psych ROS  ? GI/Hepatic ?negative GI ROS, Neg liver ROS,   ?Endo/Other  ?negative endocrine ROS ? Renal/GU ?negative Renal ROS  ?negative genitourinary ?  ?Musculoskeletal ?negative musculoskeletal ROS ?(+)  ? Abdominal ?  ?Peds ? Hematology ?negative hematology ROS ?(+)   ?Anesthesia Other Findings ?Day of surgery medications reviewed with patient. ? Reproductive/Obstetrics ?(+) Pregnancy (Hx of C/S x1) ? ?  ? ? ? ? ? ? ? ? ? ? ? ? ? ?  ?  ? ? ? ? ? ? ?Anesthesia Physical ?Anesthesia Plan ? ?ASA: 3 ? ?Anesthesia Plan: Spinal  ? ?Post-op Pain Management:   ? ?Induction:  ? ?PONV Risk Score and Plan: 3 and Treatment may vary due to age or medical condition, Dexamethasone and Ondansetron ? ?Airway Management Planned: Natural Airway ? ?Additional Equipment: Fetal Monitoring ? ?Intra-op Plan:  ? ?Post-operative Plan:  ? ?Informed Consent: I have reviewed the patients History and Physical, chart, labs and discussed the procedure including the risks, benefits and alternatives for the proposed anesthesia with the patient or authorized representative who has indicated his/her understanding and acceptance.  ? ? ? ? ? ?Plan Discussed with: CRNA ? ?Anesthesia Plan Comments:   ? ? ? ? ? ?Anesthesia Quick Evaluation ? ?

## 2021-10-26 LAB — CBC
HCT: 33.1 % — ABNORMAL LOW (ref 36.0–46.0)
Hemoglobin: 11.6 g/dL — ABNORMAL LOW (ref 12.0–15.0)
MCH: 33.9 pg (ref 26.0–34.0)
MCHC: 35 g/dL (ref 30.0–36.0)
MCV: 96.8 fL (ref 80.0–100.0)
Platelets: 134 10*3/uL — ABNORMAL LOW (ref 150–400)
RBC: 3.42 MIL/uL — ABNORMAL LOW (ref 3.87–5.11)
RDW: 12.2 % (ref 11.5–15.5)
WBC: 10.3 10*3/uL (ref 4.0–10.5)
nRBC: 0 % (ref 0.0–0.2)

## 2021-10-26 LAB — RPR: RPR Ser Ql: NONREACTIVE

## 2021-10-26 NOTE — Plan of Care (Signed)
?  Problem: Education: ?Goal: Knowledge of General Education information will improve ?Description: Including pain rating scale, medication(s)/side effects and non-pharmacologic comfort measures ?Outcome: Completed/Met ?  ?Problem: Education: ?Goal: Knowledge of condition will improve ?Outcome: Completed/Met ?  ?Problem: Activity: ?Goal: Will verbalize the importance of balancing activity with adequate rest periods ?Outcome: Completed/Met ?Goal: Ability to tolerate increased activity will improve ?Outcome: Completed/Met ?  ?

## 2021-10-26 NOTE — Progress Notes (Addendum)
Subjective: ?POD# 1 ?Live born female  ?Birth Weight: 10 lb 2.3 oz (4600 g) ?APGAR: 7, 9 ? ?Newborn Delivery   ?Birth date/time: 10/25/2021 15:08:00 ?Delivery type: C-Section, Low Transverse ?Trial of labor: No ?C-section categorization: Repeat ?  ?  ?Baby name: [name] ? ?Delivering provider: Noland Fordyce  ? ?Circumcision: Declines ? ?Feeding: breast ? ?Pain control at delivery: Spinal  ? ?Reports feeling well with minimal pain with ambulation. Husband at the bedside.  ? ?Patient reports tolerating PO.   ?Pain controlled with acetaminophen and prescription NSAID's including ketorolac (Toradol) ?Denies HA/SOB/C/P/N/V/dizziness. ?She reports vaginal bleeding as normal, without clots. She is ambulating and urinating without difficulty.    ? ?Objective:  ?Vitals:  ? 10/25/21 1928 10/25/21 2040 10/26/21 0300 10/26/21 0522  ?BP: 103/72 105/71 99/65 97/62   ?Pulse: 79 74  68  ?Resp: 18 18    ?Temp: 98.4 ?F (36.9 ?C)   98.2 ?F (36.8 ?C)  ?TempSrc: Oral   Oral  ?SpO2:  100%  98%  ?Weight:      ?Height:      ?  ?Intake/Output Summary (Last 24 hours) at 10/26/2021 0909 ?Last data filed at 10/26/2021 0530 ?Gross per 24 hour  ?Intake 1414.58 ml  ?Output 2231 ml  ?Net -816.42 ml  ?    ?Recent Labs  ?  10/25/21 ?1228 10/26/21 ?0445  ?WBC 7.0 10.3  ?HGB 13.4 11.6*  ?HCT 39.6 33.1*  ?PLT 146* 134*  ? ? Blood type: --/--/A POS (05/04 1232) ? Rubella: Nonimmune (10/05 0000)  ? ?Physical Exam:  ?General: alert and cooperative ?CV: Regular rate and rhythm ?Resp: clear ?Abdomen: soft, nontender, normal bowel sounds ?Incision: bloody drainage present ?Uterine Fundus: firm, below umbilicus, nontender ?Lochia: minimal ?Ext: extremities normal, atraumatic, no cyanosis or edema ? ?Assessment/Plan: ?36 y.o.   POD# 1. [id number]  ?                ?Principal Problem: ?  Postpartum care following cesarean delivery 5/4 ? Encourage rest when baby rests ?Breastfeeding support ?Encourage to ambulate ?Routine post-op care ?Active Problems: ?  Cesarean delivery  - repeat ?  Keloid resection ?  Macrosomia ?  S/P cesarean section ? Change honeycomb dressing ? ?Anticipate discharge tomorrow.  ? ?Clancy Gourd, MSN ?10/26/2021, 9:09 AM ? ?  ?

## 2021-10-27 MED ORDER — OXYCODONE HCL 5 MG PO TABS: 5.0000 mg | ORAL_TABLET | ORAL | 0 refills | Status: AC | PRN

## 2021-10-27 MED ORDER — ACETAMINOPHEN 500 MG PO TABS: 1000.0000 mg | ORAL_TABLET | Freq: Four times a day (QID) | ORAL | 0 refills | Status: AC

## 2021-10-27 MED ORDER — IBUPROFEN 600 MG PO TABS: 600.0000 mg | ORAL_TABLET | Freq: Four times a day (QID) | ORAL | 0 refills | Status: AC

## 2021-10-27 NOTE — Discharge Summary (Signed)
OB Discharge Summary ? ?Patient Name: Melanie Munoz ?DOB: [birth year] ?MRN: [id number] ? ?Date of admission: 10/25/2021 ?Delivering provider: Noland Fordyce  ? ?Admitting diagnosis: Macrosomia [P08.0] ?S/P cesarean section [Z98.891] ?Intrauterine pregnancy: [redacted]w[redacted]d     ?Secondary diagnosis: ?Patient Active Problem List  ? Diagnosis Date Noted  ? Keloid resection 10/25/2021  ? Macrosomia 10/25/2021  ? S/P cesarean section 10/25/2021  ? Cesarean delivery - repeat 01/10/2020  ? Postpartum care following cesarean delivery 5/4 12/03/2016  ?  ?Date of discharge: 10/27/2021   ?Discharge diagnosis: Principal Problem: ?  Postpartum care following cesarean delivery 5/4 ?Active Problems: ?  Cesarean delivery - repeat ?  Keloid resection ?  Macrosomia ?  S/P cesarean section ?                                                        ?Post partum procedures: None ? ?Augmentation: N/A ?Pain control: Spinal  ?Laceration:None  ?Complications: None ? ?Hospital course:  Scheduled C/S   36 y.o. yo G3P2103 at [redacted]w[redacted]d was admitted to the hospital 10/25/2021 for scheduled cesarean section with the following indication:Elective Repeat.Delivery details are as follows:  ?Membrane Rupture Time/Date: 3:07 PM ,10/25/2021   ?Delivery Method:C-Section, Low Transverse  ?Details of operation can be found in separate operative note.  Patient had an uncomplicated postpartum course.  She is ambulating, tolerating a regular diet, passing flatus, and urinating well. Patient is discharged home in stable condition on  10/27/21 ?       ?Newborn Data: ?Birth date:10/25/2021  ?Birth time:3:08 PM  ?Gender:Female  ?Living status:Living  ?Apgars:7 ,9  ?Weight:4600 g    ? ?Physical exam  ?Vitals:  ? 10/26/21 0930 10/26/21 1334 10/26/21 2027 10/27/21 0552  ?BP: 112/71 102/67 109/69 117/79  ?Pulse: 83 83 86 80  ?Resp: 18 16 18 18   ?Temp: 97.9 ?F (36.6 ?C) 97.8 ?F (36.6 ?C) 98 ?F (36.7 ?C) 97.7 ?F (36.5 ?C)  ?TempSrc: Oral Oral Oral Oral  ?SpO2:  98%    ?Weight:      ?Height:       ? ?General: alert and cooperative ?Lochia: appropriate ?Uterine Fundus: firm ?Incision: Healing well with no significant drainage ?DVT Evaluation: No evidence of DVT seen on physical exam. ? ?Labs: ?Lab Results  ?Component Value Date  ? WBC 10.3 10/26/2021  ? HGB 11.6 (L) 10/26/2021  ? HCT 33.1 (L) 10/26/2021  ? MCV 96.8 10/26/2021  ? PLT 134 (L) 10/26/2021  ? ? ?  10/25/2021  ?  5:15 PM 01/12/2020  ? 11:30 AM  ?01/14/2020 Postnatal Depression Scale Screening Tool  ?I have been able to laugh and see the funny side of things. 0 0  ?I have looked forward with enjoyment to things. 0 0  ?I have blamed myself unnecessarily when things went wrong. 1 1  ?I have been anxious or worried for no good reason. 0 0  ?I have felt scared or panicky for no good reason. 1 1  ?Things have been getting on top of me. 0 1  ?I have been so unhappy that I have had difficulty sleeping. 1 1  ?I have felt sad or miserable. 1 1  ?I have been so unhappy that I have been crying. 1 1  ?The thought of harming myself has occurred to me. 0 0  ?Edinburgh Postnatal Depression Scale  Total 5 6  ? ?Discharge instructions:  ?per After Visit Summary ? ?After Visit Meds:  ?Allergies as of 10/27/2021   ? ?   Reactions  ? Morphine And Related   ? Patient feels like "my inside of my body is on fire"  ? ?  ? ?  ?Medication List  ?  ? ?TAKE these medications   ? ?acetaminophen 500 MG tablet ?Commonly known as: TYLENOL ?Take 2 tablets (1,000 mg total) by mouth every 6 (six) hours. ?  ?cetirizine 10 MG tablet ?Commonly known as: ZYRTEC ?Take 10 mg by mouth daily. ?  ?ibuprofen 600 MG tablet ?Commonly known as: ADVIL ?Take 1 tablet (600 mg total) by mouth every 6 (six) hours. ?  ?montelukast 10 MG tablet ?Commonly known as: SINGULAIR ?Take 10 mg by mouth daily. ?  ?oxyCODONE 5 MG immediate release tablet ?Commonly known as: Oxy IR/ROXICODONE ?Take 1-2 tablets (5-10 mg total) by mouth every 4 (four) hours as needed for moderate pain. ?  ?prenatal multivitamin Tabs  tablet ?Take 1 tablet by mouth daily. ?  ?vitamin C 500 MG tablet ?Commonly known as: ASCORBIC ACID ?Take 500 mg by mouth daily. ?  ? ?  ? ?Activity: Advance as tolerated. Pelvic rest for 6 weeks.  ? ?Newborn Data: ?Live born female  ?Birth Weight: 10 lb 2.3 oz (4600 g) ?APGAR: 7, 9 ? ?Newborn Delivery   ?Birth date/time: 10/[birth year]/2021 15:08:00 ?Delivery type: C-Section, Low Transverse ?Trial of labor: No ?C-section categorization: Repeat ?  ?  ?Named Melanie Munoz ?Baby Feeding: Breast ?Disposition:home with mother ? ?Delivery Report: ? ?Review the Delivery Report for details.   ? ?Follow up: ? Follow-up Information   ? ? Noland Fordyce, MD. Go in 2 week(s).   ?Specialty: Obstetrics and Gynecology ?Why: As scheduled. ?Contact information: ?[address] ?[place] [address] ?[phone] ? ? ?  ?  ? ?  ?  ? ?  ? ?Clancy Gourd, MSN ?10/27/2021, 10:49 AM ?  ?

## 2021-11-02 ENCOUNTER — Telehealth (HOSPITAL_COMMUNITY): Payer: Self-pay

## 2021-11-02 NOTE — Telephone Encounter (Signed)
No answer. Left message to return nurse call. ? Heron Nay ?05/12//2023,1603 ?

## 2023-02-24 IMAGING — US US OB COMP LESS 14 WK
1 series · 14 of 28 positions shown · non-contrast
Comparison: none

[Series 1: us ob comp less 14 wk · 51 acquisitions, 14 frames shown]
[im 2/51]
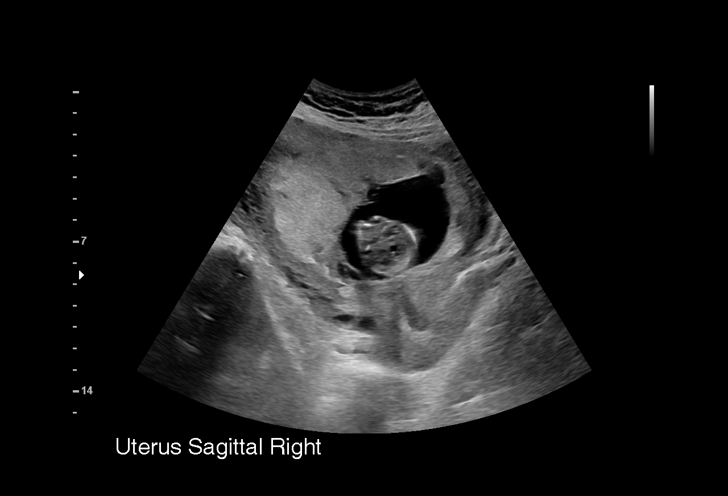
[im 6/51]
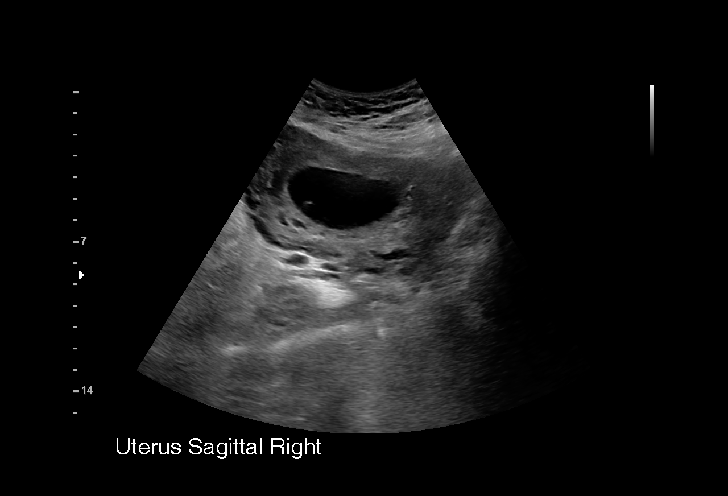
[im 10/51]
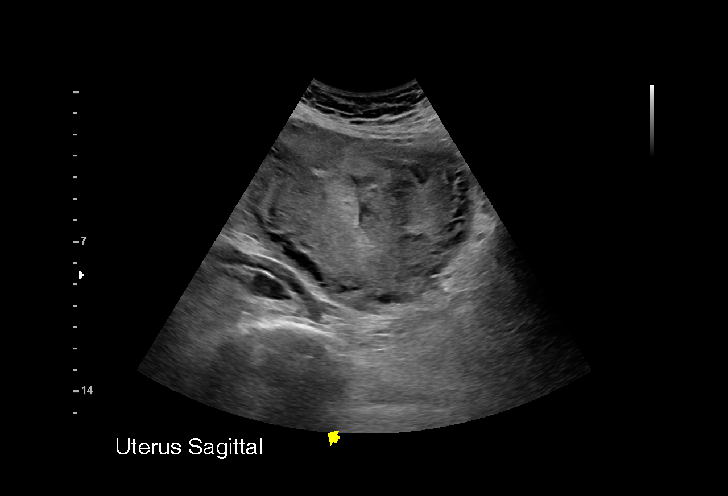
[im 13/51]
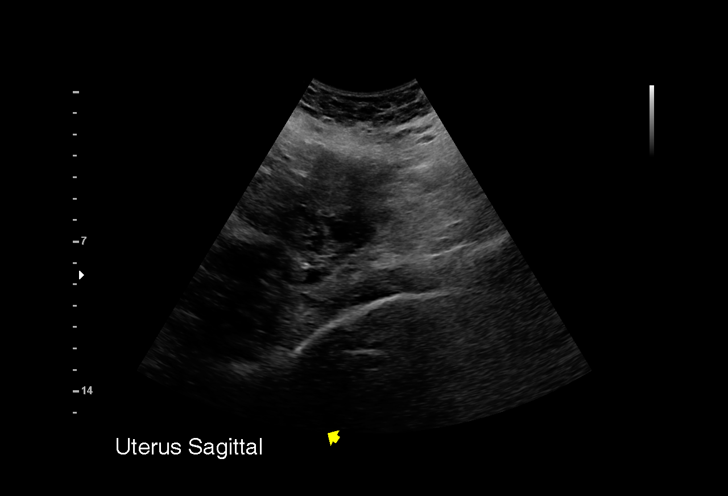
[im 17/51]
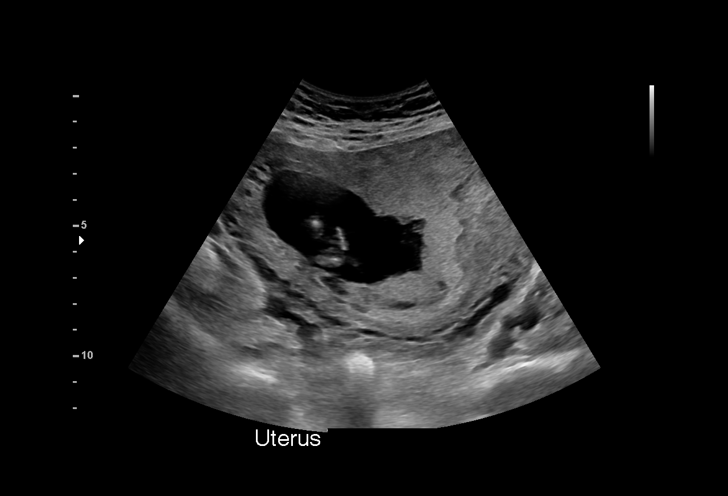
[im 21/51]
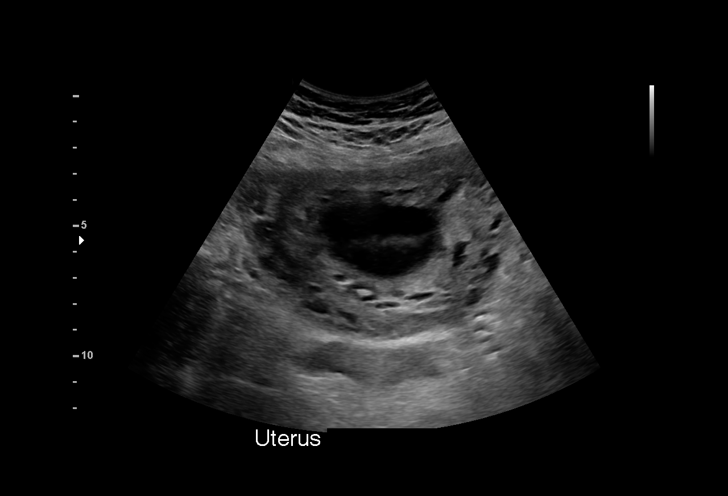
[im 25/51]
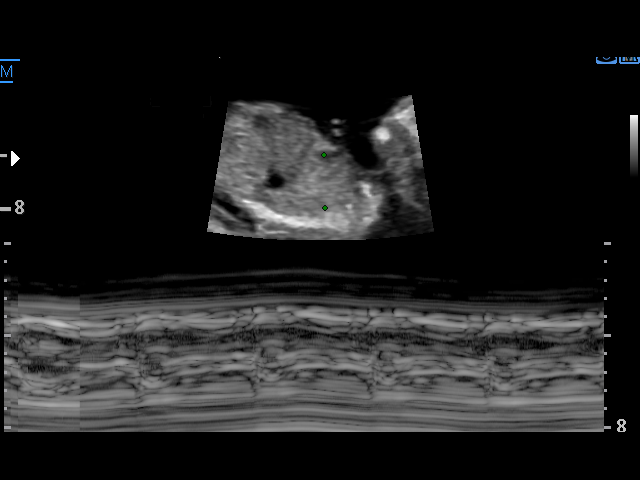
[im 28/51]
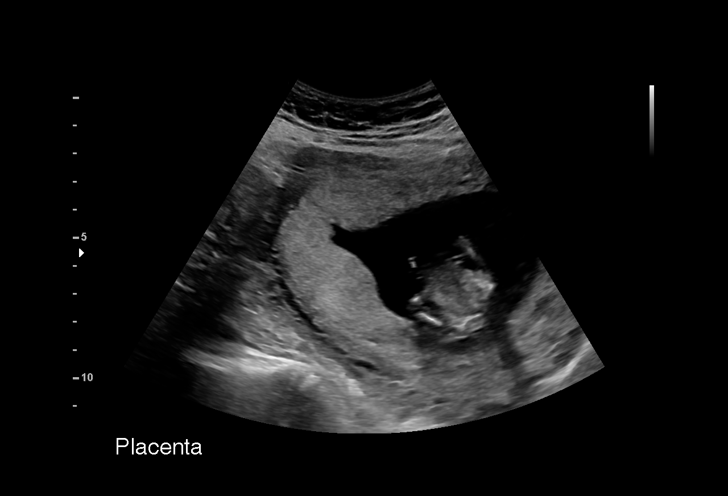
[im 32/51]
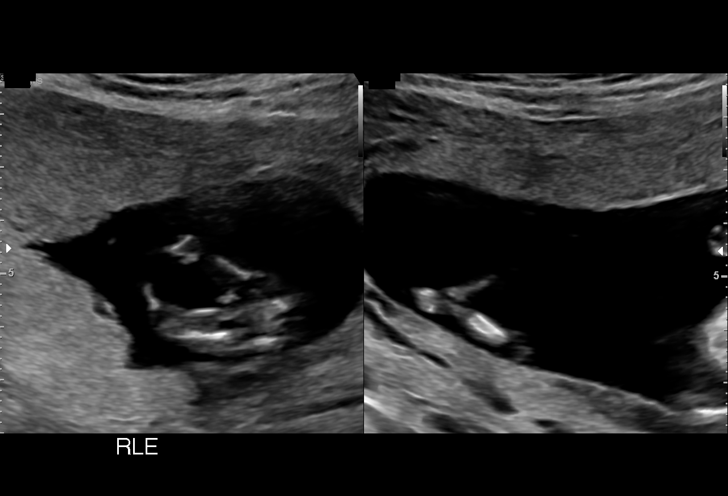
[im 36/51]
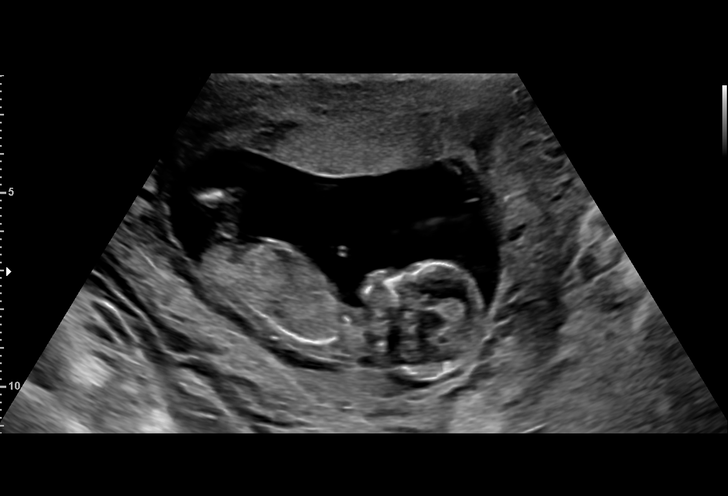
[im 39/51]
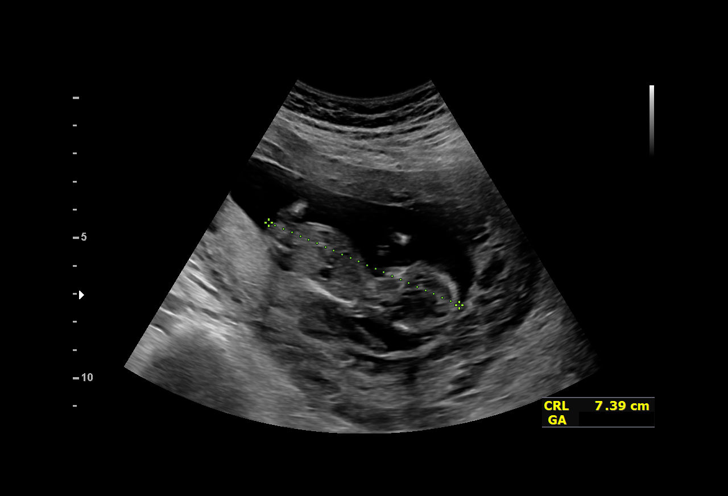
[im 43/51]
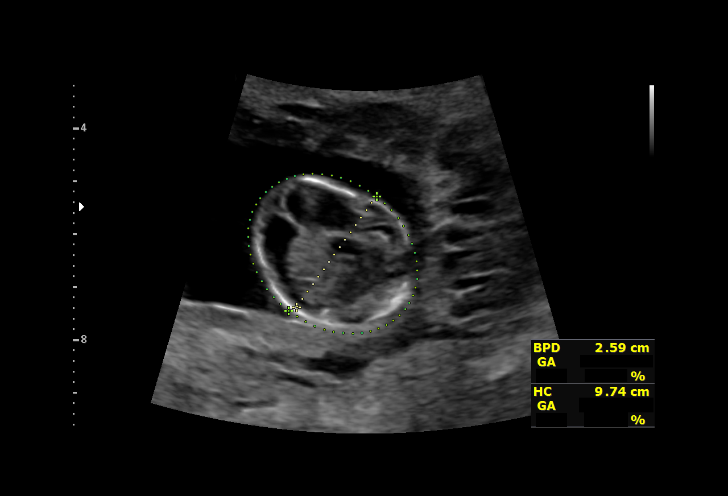
[im 47/51]
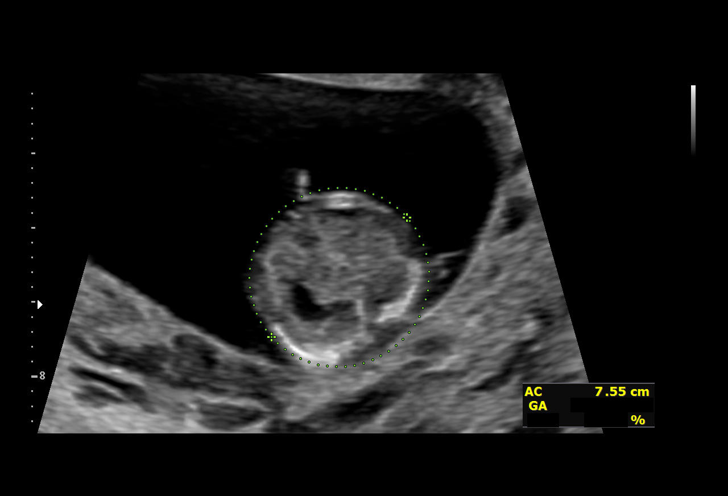
[im 51/51]
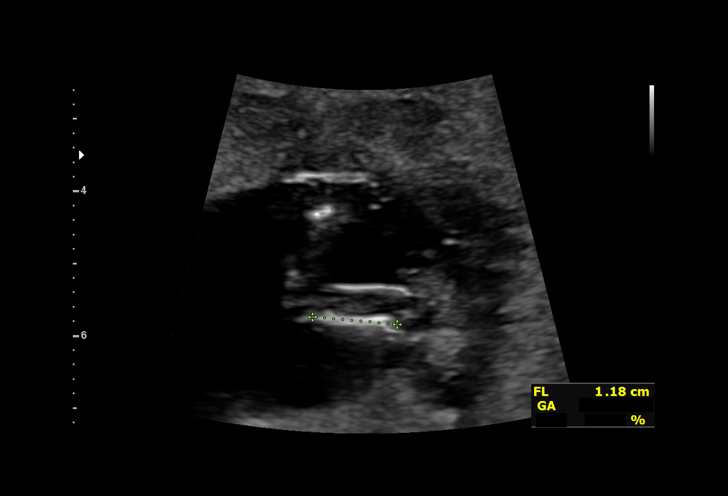

[14 of 28 positions shown; findings below may reference images not displayed]

ANATOMY

Indications

 13 weeks gestation of pregnancy
 Encounter for antenatal screening for
 malformations
 Abnormal finding on antenatal screening
 (Atypical finding sex chromosomes)
 Low Risk Triploidy
 Advanced maternal age multigravida 35+,
 second trimester
 History of cesarean delivery, currently
 pregnant
 Poor obstetrical history (hx of placenta
 previa)
Fetal Evaluation

 Num Of Fetuses:         1
 Fetal Heart Rate(bpm):  150
 Cardiac Activity:       Observed
 Presentation:           Variable
 Placenta:               Posterior
 P. Cord Insertion:      Visualized
Biometry

 CRL:      69.7  mm     G. Age:  13w 0d                  EDD:   10/26/21

 BPD:      25.3  mm     G. Age:  14w 3d                  CI:        71.86   %    70 - 86
                                                         FL/HC:      12.8   %
 HC:         95  mm     G. Age:  14w 2d                  HC/AC:      1.24        1.14 -
 AC:       76.7  mm     G. Age:  14w 1d                  FL/BPD:     48.2   %
 FL:       12.2  mm     G. Age:  13w 4d                  FL/AC:      15.9   %    20 - 24
 Est. FW:      86  gm      0 lb 3 oz
OB History

 Gravidity:    3         Term:   2
 Living:       2
Gestational Age

 LMP:           13w 5d        Date:  01/14/21                 EDD:   10/21/21
 U/S Today:     14w 1d                                        EDD:   10/18/21
 Best:          13w 5d     Det. By:  LMP  (01/14/21)          EDD:   10/21/21
Anatomy

 Cranium:               Visualized             Stomach:                Visualized
 Choroid Plexus:        Visualized             Abdominal Wall:         Visualized
 Thoracic:              Visualized             Bladder:                Visualized
 Heart:                 Visualized             Upper Extremities:      Visualized
 Diaphragm:             Visualized             Lower Extremities:      Visualized

 Other:  Technically difficult due to fetal position.
Impression

 Single intrauterine pregnancy with measurements consistent
 with dates.

 The first trimester anatomy was normal, however, a detailed
 anatomy was precluded due to early gestational age.

 Therefore we have scheduled Ms. Rask to return in 5-7
 weeks for an anatomical survey.

 She met with our genetic counselor today given abnormal sex
 chromosome report on her low risk NIPS.
Recommendations

 Follow up growth in 5-7 weeks.

## 2024-03-16 ENCOUNTER — Encounter (INDEPENDENT_AMBULATORY_CARE_PROVIDER_SITE_OTHER): Payer: Self-pay | Admitting: Otolaryngology

## 2024-03-16 ENCOUNTER — Ambulatory Visit (INDEPENDENT_AMBULATORY_CARE_PROVIDER_SITE_OTHER): Payer: Self-pay | Admitting: Otolaryngology

## 2024-03-16 VITALS — BP 107/74 | HR 66 | Ht 67.0 in | Wt 184.0 lb

## 2024-03-16 DIAGNOSIS — R490 Dysphonia: Secondary | ICD-10-CM

## 2024-03-16 DIAGNOSIS — J310 Chronic rhinitis: Secondary | ICD-10-CM

## 2024-03-16 DIAGNOSIS — R0982 Postnasal drip: Secondary | ICD-10-CM

## 2024-03-16 DIAGNOSIS — R0981 Nasal congestion: Secondary | ICD-10-CM | POA: Diagnosis not present

## 2024-03-16 NOTE — Patient Instructions (Addendum)
 Keep track of symptoms -- when symptoms are about to happen, take reflux gourmet (can buy it onlilne at [place]) after lunch and dinner. Do salt water gargles, use the astelin spray twice (morning and evening), and keep hydrated. Use Neti Pot daily.  When you are feeling bad, call our office  Muscle tension dysphonia exercises:  [email or web address]

## 2024-03-16 NOTE — Progress Notes (Signed)
 Dear Dr. Frutoso, Here is my assessment for our mutual patient, Melanie Munoz. Thank you for allowing me the opportunity to care for your patient. Please do not hesitate to contact me should you have any other questions. Sincerely, Dr. Eldora Blanch  Otolaryngology Clinic Note  HISTORY: Melanie Munoz is a 38 y.o. female kindly referred by Dr. Frutoso for evaluation of dysphonia and rhinitis  Initial visit (02/2024): She reports that she has chronic rhinitis and some post nasal drip and some nasal congestion (particularly in the morning) but her main complaint today is recurrent episodes of laryngitis. She reports that she has never completely lost voice, but reports has episodes where she can whisper and is dysphonic with some sore throat. Talking seems to be uncomfortable during this. Occurs typically when the weather changes or things get colder, which often triggers her symptoms. No CRS/frequent sinus infection sx. Nasal symptoms also get worse during that time. Voice improves with rest and hot tea, currently back to normal. Last occurrence was around Sept 5th.  She is currently on albuterol - as needed but no scheduled inhaler. Does not report reflux - no belching, epigastric discomfort, coughing Currently, voice is normal  She is on PO anthistamine, nasacort , and is about to start astelin. She does use a Neti Pot as needed  Patient otherwise denies: - dysphagia, aspiration episodes or PNA, unintentional weight loss - shortness of breath, hemoptysis - ear pain, neck masses   Allergy testing has been done - negative. No previous sinonasal surgery.  GLP-1: no AP/AC: no  Tobacco: no  PMHx: Rhinitis, Periodic Migraines  RADIOGRAPHIC EVALUATION AND INDEPENDENT REVIEW OF OTHER RECORDS:: Dr. Frutoso (01/05/2024) Referral notes reviewed and uploaded or available in chart in media tab - noted cough controlled, problem with laryngitis when she has rhinitis; using nasacort  as needed; using PO  anthistamine and singulair; Prior AIT not reactive; Dx: Vasomotor rhinitis; Stop nasacort , using PO anthistamine; start astelin and irrigations; ref to ENT Skin testing 01/29/2023 reviewed: negative CMP and CBC 04/16/2023: BUN/Cr 13/0.66; WBC 4, Eos 0  Past Medical History:  Diagnosis Date   Medical history non-contributory    Past Surgical History:  Procedure Laterality Date   APPENDECTOMY     CESAREAN SECTION N/A 12/03/2016   Procedure: CESAREAN SECTION;  Surgeon: Kandyce Sor, MD;  Location: [facility] BIRTHING SU[facility];  Service: Obstetrics;  Laterality: N/A;   CESAREAN SECTION     CESAREAN SECTION N/A 10/25/2021   Procedure: Repeat CESAREAN SECTION;  Surgeon: Kandyce Sor, MD;  Location: [facility] LD ORS;  Service: Obstetrics;  Laterality: N/A;  with keloid resection   Family History  Problem Relation Age of Onset   Cancer Father    Cancer Maternal Grandmother    Diabetes Maternal Grandfather    Social History   Tobacco Use   Smoking status: Never   Smokeless tobacco: Never  Substance Use Topics   Alcohol use: No    Alcohol/week: 0.0 standard drinks of alcohol   Allergies  Allergen Reactions   Sulfa Antibiotics Other (See Comments)    Sulfa eye drops caused burning and red itching   Morphine  And Codeine     Patient feels like my inside of my body is on fire   Current Outpatient Medications  Medication Sig Dispense Refill   acetaminophen  (TYLENOL ) 500 MG tablet Take 2 tablets (1,000 mg total) by mouth every 6 (six) hours. 30 tablet 0   albuterol (VENTOLIN HFA) 108 (90 Base) MCG/ACT inhaler 1-2 puff as needed Inhalation q 4-6  hours prn cough/wheeze     azelastine (OPTIVAR) 0.05 % ophthalmic solution 1 drop into affected eye Ophthalmic Twice a day; Duration: 30 days     cetirizine (ZYRTEC) 10 MG tablet Take 10 mg by mouth daily.     ibuprofen  (ADVIL ) 600 MG tablet Take 1 tablet (600 mg total) by mouth every 6 (six) hours. 30 tablet 0   montelukast (SINGULAIR) 10 MG tablet Take 10 mg  by mouth daily.     oxyCODONE  (OXY IR/ROXICODONE ) 5 MG immediate release tablet Take 1-2 tablets (5-10 mg total) by mouth every 4 (four) hours as needed for moderate pain. 30 tablet 0   Prenatal Vit-Fe Fumarate-FA (PRENATAL MULTIVITAMIN) TABS tablet Take 1 tablet by mouth daily.     vitamin C (ASCORBIC ACID) 500 MG tablet Take 500 mg by mouth daily.     No current facility-administered medications for this visit.   BP 107/74 (BP Location: Left Arm, Patient Position: Sitting, Cuff Size: Large)   Pulse 66   Ht 5' 7 (1.702 m)   Wt 184 lb (83.5 kg)   SpO2 97%   BMI 28.82 kg/m   PHYSICAL EXAM:  BP 107/74 (BP Location: Left Arm, Patient Position: Sitting, Cuff Size: Large)   Pulse 66   Ht 5' 7 (1.702 m)   Wt 184 lb (83.5 kg)   SpO2 97%   BMI 28.82 kg/m    Salient findings:  CN II-XII intact  Bilateral EAC clear and TM intact with well pneumatized middle ear spaces Nose: Anterior rhinoscopy reveals septum relatively midline, bilateral inferior turbinates without significant hypertrophy.  Nasal endoscopy was indicated to better evaluate the nose and paranasal sinuses, given the patient's history and exam findings, and is detailed below. No lesions of oral cavity/oropharynx No obviously palpable neck masses/lymphadenopathy/thyromegaly No respiratory distress or stridor; voice quality class 1.5; TFL was indicated to better evaluate the proximal airway, given the patient's history and exam findings, and is detailed below.   PROCEDURE:  Prior to initiating any procedures, risks/benefits/alternatives were explained to the patient and verbal consent obtained. Diagnostic Nasal Endoscopy Pre-procedure diagnosis: Concern for chronic rhinitis, post nasal drip, nasal congestion Post-procedure diagnosis: same Indication: See pre-procedure diagnosis and physical exam above Complications: None apparent EBL: 0 mL Anesthesia: Lidocaine  4% and topical decongestant was topically sprayed in each nasal  cavity  Description of Procedure:  Patient was identified. A rigid 30 degree endoscope was utilized to evaluate the sinonasal cavities, mucosa, sinus ostia and turbinates and septum.  Overall, signs of mucosal inflammation are not noted.  No mucopurulence, polyps, or masses noted.   Right Middle meatus: clear Right SE Recess: clear Left MM: clear Left SE Recess: clear  Photodocumentation was obtained.  CPT CODE -- 68768 - Mod 25  Procedure Note Pre-procedure diagnosis:  Recurrent Dysphonia Post-procedure diagnosis: Same Procedure: Transnasal Fiberoptic Laryngoscopy, CPT 31575 - Mod 25 Indication: see above Complications: None apparent EBL: 0 mL  The procedure was undertaken to further evaluate the patient's complaint above, with mirror exam inadequate for appropriate examination due to gag reflex and poor patient tolerance  Procedure:  Patient was identified as correct patient. Verbal consent was obtained. The nose was sprayed with oxymetazoline and 4% lidocaine . The The flexible laryngoscope was passed through the nose to view the nasal cavity, pharynx (oropharynx, hypopharynx) and larynx.  The larynx was examined at rest and during multiple phonatory tasks. Documentation was obtained and reviewed with patient. The scope was removed. The patient tolerated the procedure well.  Findings:  The nasal cavity and nasopharynx did not reveal any masses or lesions, mucosa appeared to be without obvious lesions. The tongue base, pharyngeal walls, piriform sinuses, vallecula, epiglottis and postcricoid region are normal in appearance. Mild post-cricoid edema; The visualized portion of the subglottis and proximal trachea is widely patent. The vocal folds are mobile bilaterally. There are no lesions on the free edge of the vocal folds nor elsewhere in the larynx worrisome for malignancy.  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 with:  1. Nasal congestion   2. Post-nasal drip   3. Chronic rhinitis   4. Dysphonia    Appears to have NAR symptoms. No h/o frequent sinus infections. Intermittent dysphonia assoc with worsening of nasal symptoms but also with significant voice use and discomfort, so may also have a MTD component. We discussed other possible contributions for this including PND and perhaps LPR  PLAN: We've discussed issues and options today.  The risks, benefits and alternatives were discussed and questions answered.  She has elected to proceed with:  1) Daily Irrigations 2) Trial MTD exercises, linked 3) Reflux gourmet/barrier agent 4) During episodes, can use astelin spray BID PRN D/w pt f/u, she will call when she has symptoms and we can perform TFL at that time to assess and rule out if anything else contributing.   Thank you for allowing me the opportunity to care for your patient. Please do not hesitate to contact me should you have any other questions.  Sincerely, Eldora Blanch, MD Otolaryngologist (ENT), [facility] Phone: [phone] Fax: [phone]  MDM:  Level 4: [id number] Complexity/Problems addressed: mod Data complexity: mod - independent review of note, labs, test - Morbidity: mod  - Drug prescribed or managed: y  03/21/2024, 1:27 PM
